# Patient Record
Sex: Female | Born: 1965 | Race: White | Hispanic: No | Marital: Married | Smoking: Never smoker
Health system: Southern US, Community
[De-identification: ages and names within clinical notes are randomized; demographics above are authoritative.]

## PROBLEM LIST (undated history)

## (undated) DIAGNOSIS — I10 Essential (primary) hypertension: Secondary | ICD-10-CM

## (undated) HISTORY — DX: Essential (primary) hypertension: I10

## (undated) HISTORY — PX: NO PAST SURGERIES: SHX2092

---

## 2005-01-24 ENCOUNTER — Ambulatory Visit: Payer: Self-pay | Admitting: Unknown Physician Specialty

## 2006-02-12 ENCOUNTER — Ambulatory Visit: Payer: Self-pay | Admitting: Unknown Physician Specialty

## 2007-04-01 ENCOUNTER — Ambulatory Visit: Payer: Self-pay | Admitting: Unknown Physician Specialty

## 2008-04-26 ENCOUNTER — Ambulatory Visit: Payer: Self-pay | Admitting: Unknown Physician Specialty

## 2011-08-11 LAB — HM PAP SMEAR: HM Pap smear: NEGATIVE

## 2017-09-22 ENCOUNTER — Encounter: Payer: Self-pay | Admitting: Physician Assistant

## 2017-09-22 ENCOUNTER — Ambulatory Visit: Payer: 59 | Admitting: Physician Assistant

## 2017-09-22 VITALS — BP 134/86 | HR 68 | Temp 98.5°F | Resp 16 | Ht 68.0 in | Wt 238.0 lb

## 2017-09-22 DIAGNOSIS — Z131 Encounter for screening for diabetes mellitus: Secondary | ICD-10-CM

## 2017-09-22 DIAGNOSIS — Z13220 Encounter for screening for lipoid disorders: Secondary | ICD-10-CM

## 2017-09-22 DIAGNOSIS — Z1329 Encounter for screening for other suspected endocrine disorder: Secondary | ICD-10-CM | POA: Diagnosis not present

## 2017-09-22 DIAGNOSIS — E669 Obesity, unspecified: Secondary | ICD-10-CM

## 2017-09-22 DIAGNOSIS — Z114 Encounter for screening for human immunodeficiency virus [HIV]: Secondary | ICD-10-CM | POA: Diagnosis not present

## 2017-09-22 DIAGNOSIS — Z6835 Body mass index (BMI) 35.0-35.9, adult: Secondary | ICD-10-CM

## 2017-09-22 DIAGNOSIS — Z130 Encounter for screening for diseases of the blood and blood-forming organs and certain disorders involving the immune mechanism: Secondary | ICD-10-CM | POA: Diagnosis not present

## 2017-09-22 DIAGNOSIS — Z1231 Encounter for screening mammogram for malignant neoplasm of breast: Secondary | ICD-10-CM

## 2017-09-22 DIAGNOSIS — Z0000 Encounter for general adult medical examination without abnormal findings: Secondary | ICD-10-CM | POA: Diagnosis not present

## 2017-09-22 DIAGNOSIS — Z1159 Encounter for screening for other viral diseases: Secondary | ICD-10-CM | POA: Diagnosis not present

## 2017-09-22 DIAGNOSIS — Z1211 Encounter for screening for malignant neoplasm of colon: Secondary | ICD-10-CM | POA: Diagnosis not present

## 2017-09-22 DIAGNOSIS — Z1239 Encounter for other screening for malignant neoplasm of breast: Secondary | ICD-10-CM

## 2017-09-22 NOTE — Patient Instructions (Signed)
Serving Sizes  A serving size is a measured amount of food or drink, such as one slice of bread, that has an associated nutrient content. Knowing the serving size of a food or drink can help you determine how much of that food you should consume.  What is the size of one serving?  The size of one healthy serving depends on the food or drink. To determine a serving size, read the food label. If the food or drink does not have a food label, try to find serving size information online. Or, use the following to estimate the size of one adult serving:  Grain  1 slice bread.  bagel.  cup pasta.  Vegetable   cup cooked or canned vegetables. 1 cup raw, leafy greens.  Fruit   cup canned fruit. 1 medium fruit.  cup dried fruit.  Meat and Other Protein Sources  1 oz meat, poultry, or fish.  cup cooked beans. 1 egg.  cup nuts or seeds. 1 Tbsp nut butter.  cup tofu or tempeh. 2 Tbsp hummus.  Dairy  An individual container of yogurt (6-8 oz). 1 piece of cheese the size of your thumb (1 oz). 1 cup (8 oz) milk or milk alternative.  Fat  A piece the size of one dice. 1 tsp soft margarine. 1 Tbsp mayonnaise. 1 tsp vegetable oil. 1 Tbsp regular salad dressing. 2 Tbsp low-fat salad dressing.  How many servings should I eat from each food group each day?  The following are the suggested number of servings to try and have every day from each food group. You can also look at your eating throughout the week and aim for meeting these requirements on most days for overall healthy eating.  Grain  6-8 servings. Try to have half of your grains from whole grains, such as whole wheat bread, corn tortillas, oatmeal, brown rice, whole wheat pasta, and bulgur.  Vegetable  At least 2-3 servings.  Fruit  2 servings.  Meat and Other Protein Foods  5-6 servings. Aim to have lean proteins, such as chicken, turkey, fish, beans, or tofu.  Dairy  3 servings. Choose low-fat or nonfat if you are trying to control your weight.  Fat  2-3  servings.  Is a serving the same thing as a portion?  No. A portion is the actual amount you eat, which may be more than one serving. Knowing the specific serving size of a food and the nutritional information that goes with it can help you make a healthy decision on what size portion to eat.  What are some tips to help me learn healthy serving sizes?  Check food labels for serving sizes. Many foods that come as a single portion actually contain multiple servings.  Determine the serving size of foods you commonly eat and figure out how large a portion you usually eat.  Measure the number of servings that can be held by the bowls, glasses, cups, and plates you typically use. For example, pour your breakfast cereal into your regular bowl and then pour it into a measuring cup.  For 1-2 days, measure the serving sizes of all the foods you eat.  Practice estimating serving sizes and determining how big your portions should be.  This information is not intended to replace advice given to you by your health care provider. Make sure you discuss any questions you have with your health care provider.  Document Released: 12/28/2002 Document Revised: 11/24/2015 Document Reviewed: 06/28/2013  Elsevier Interactive Patient   Education  2018 Elsevier Inc.

## 2017-09-22 NOTE — Progress Notes (Signed)
Patient: Madeline Bryant Female    DOB: Feb 29, 1966   52 y.o.   MRN: [id number] Visit Date: 09/23/2017  Today's Provider: Trey SailorsAdriana M Pollak, PA-C   Chief Complaint  Patient presents with  . Establish Care   Subjective:    HPI  Madeline Bryant is a 52 y/o woman presenting today to establish care. 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 Her husband is a patient here as well. [facility]. Due for mammogram. Has had borderline high BP in the past. Has lost 9 lbs with weight watchers.    No Known Allergies   Current Outpatient Medications:  Marland Kitchen.  Multiple Vitamin (MULTIVITAMIN) tablet, Take 1 tablet by mouth daily., Disp: , Rfl:   Review of Systems  Constitutional: Negative.   HENT: Negative.   Eyes: Negative.   Respiratory: Negative.   Cardiovascular: Negative.   Gastrointestinal: Negative.   Endocrine: Negative.   Genitourinary: Negative.   Musculoskeletal: Negative.   Skin: Negative.   Allergic/Immunologic: Negative.   Neurological: Negative.   Hematological: Negative.   Psychiatric/Behavioral: Negative.     Social History   Tobacco Use  . Smoking status: Never Smoker  . Smokeless tobacco: Never Used  Substance Use Topics  . Alcohol use: Never    Frequency: Never   Objective:   BP 134/86 (BP Location: Right Arm, Patient Position: Sitting, Cuff Size: Large)   Pulse 68   Temp 98.5 F (36.9 C) (Oral)   Resp 16   Ht 5\' 8"  (1.727 m)   Wt 238 lb (108 kg)   BMI 36.19 kg/m  Vitals:   09/22/17 1415  BP: 134/86  Pulse: 68  Resp: 16  Temp: 98.5 F (36.9 C)  TempSrc: Oral  Weight: 238 lb (108 kg)  Height: 5\' 8"  (1.727 m)     Physical Exam  Constitutional: She is oriented to person, place, and time. She appears well-developed and well-nourished.  Cardiovascular: Normal rate and regular rhythm.  Pulmonary/Chest: Effort normal and breath sounds normal.  Abdominal: Soft. Bowel sounds are normal.    Neurological: She is alert and oriented to person, place, and time.  Skin: Skin is warm and dry.  Psychiatric: She has a normal mood and affect. Her behavior is normal.        Assessment & Plan:     1. Class 2 obesity without serious comorbidity with body mass index (BMI) of 35.0 to 35.9 in adult, unspecified obesity type  Get labs and request records, update PAP at physical exam.  2. Diabetes mellitus screening  - Comprehensive Metabolic Panel (CMET)  3. Thyroid disorder screening  - TSH  4. Lipid screening  - Lipid Profile  5. Screening for deficiency anemia  - CBC with Differential  6. Encounter for screening for HIV  - HIV antibody (with reflex)  7. Need for hepatitis C screening test  - Hepatitis c antibody (reflex)  8. Breast cancer screening  - MM 3D SCREEN BREAST BILATERAL; Future  9. Colon cancer screening  - Cologuard  Return in about 1 month (around 10/22/2017) for CPE.  The entirety of the information documented in the History of Present Illness, Review of Systems and Physical Exam were personally obtained by me. Portions of this information were initially documented by Kavin LeechLaura Walsh, CMA and reviewed by me for thoroughness and accuracy.        Trey SailorsAdriana M Pollak, PA-C  [facility]

## 2017-09-24 ENCOUNTER — Ambulatory Visit
Admission: RE | Admit: 2017-09-24 | Discharge: 2017-09-24 | Disposition: A | Discharge status: Home / Self Care | Payer: 59 | Source: Ambulatory Visit | Attending: Physician Assistant | Admitting: Physician Assistant

## 2017-09-24 DIAGNOSIS — Z1231 Encounter for screening mammogram for malignant neoplasm of breast: Secondary | ICD-10-CM | POA: Insufficient documentation

## 2017-09-24 DIAGNOSIS — Z1239 Encounter for other screening for malignant neoplasm of breast: Secondary | ICD-10-CM

## 2017-09-25 ENCOUNTER — Telehealth: Payer: Self-pay

## 2017-09-25 LAB — COMPREHENSIVE METABOLIC PANEL WITH GFR
ALT: 36 IU/L — ABNORMAL HIGH (ref 0–32)
AST: 31 IU/L (ref 0–40)
Albumin/Globulin Ratio: 1.6 (ref 1.2–2.2)
Albumin: 4.3 g/dL (ref 3.5–5.5)
Alkaline Phosphatase: 94 IU/L (ref 39–117)
BUN/Creatinine Ratio: 15 (ref 9–23)
BUN: 11 mg/dL (ref 6–24)
Bilirubin Total: 0.4 mg/dL (ref 0.0–1.2)
CO2: 25 mmol/L (ref 20–29)
Calcium: 8.9 mg/dL (ref 8.7–10.2)
Chloride: 105 mmol/L (ref 96–106)
Creatinine, Ser: 0.74 mg/dL (ref 0.57–1.00)
GFR calc Af Amer: 107 mL/min/1.73
GFR calc non Af Amer: 93 mL/min/1.73
Globulin, Total: 2.7 g/dL (ref 1.5–4.5)
Glucose: 87 mg/dL (ref 65–99)
Potassium: 4.1 mmol/L (ref 3.5–5.2)
Sodium: 145 mmol/L — ABNORMAL HIGH (ref 134–144)
Total Protein: 7 g/dL (ref 6.0–8.5)

## 2017-09-25 LAB — CBC WITH DIFFERENTIAL/PLATELET
Basophils Absolute: 0.1 10*3/uL (ref 0.0–0.2)
Basos: 1 %
EOS (ABSOLUTE): 0.1 10*3/uL (ref 0.0–0.4)
Eos: 2 %
Hematocrit: 44 % (ref 34.0–46.6)
Hemoglobin: 15 g/dL (ref 11.1–15.9)
Immature Grans (Abs): 0 10*3/uL (ref 0.0–0.1)
Immature Granulocytes: 0 %
Lymphocytes Absolute: 2.2 10*3/uL (ref 0.7–3.1)
Lymphs: 41 %
MCH: 29.2 pg (ref 26.6–33.0)
MCHC: 34.1 g/dL (ref 31.5–35.7)
MCV: 86 fL (ref 79–97)
Monocytes Absolute: 0.3 10*3/uL (ref 0.1–0.9)
Monocytes: 5 %
Neutrophils Absolute: 2.9 10*3/uL (ref 1.4–7.0)
Neutrophils: 51 %
Platelets: 223 10*3/uL (ref 150–450)
RBC: 5.13 x10E6/uL (ref 3.77–5.28)
RDW: 14.7 % (ref 12.3–15.4)
WBC: 5.5 10*3/uL (ref 3.4–10.8)

## 2017-09-25 LAB — LIPID PANEL
Chol/HDL Ratio: 3.3 ratio (ref 0.0–4.4)
Cholesterol, Total: 173 mg/dL (ref 100–199)
HDL: 52 mg/dL (ref >39)
LDL Calculated: 105 mg/dL — ABNORMAL HIGH (ref 0–99)
Triglycerides: 80 mg/dL (ref 0–149)
VLDL Cholesterol Cal: 16 mg/dL (ref 5–40)

## 2017-09-25 LAB — TSH: TSH: 2.45 u[IU]/mL (ref 0.450–4.500)

## 2017-09-25 LAB — HIV ANTIBODY (ROUTINE TESTING W REFLEX): HIV Screen 4th Generation wRfx: NONREACTIVE

## 2017-09-25 LAB — HCV COMMENT:

## 2017-09-25 LAB — HEPATITIS C ANTIBODY (REFLEX): HCV Ab: <0.1 {s_co_ratio} (ref 0.0–0.9)

## 2017-09-25 NOTE — Telephone Encounter (Signed)
-----   Message from Trey SailorsAdriana M Pollak, [place]PA-C sent at 09/25/2017  1:43 PM EDT ----- Labs all normal.

## 2017-09-25 NOTE — Telephone Encounter (Signed)
Pt advised.   Thanks,   -Laura  

## 2017-09-30 ENCOUNTER — Telehealth: Payer: Self-pay | Admitting: Physician Assistant

## 2017-09-30 NOTE — Telephone Encounter (Signed)
Order for cologuard faxed to [facility] °

## 2017-10-08 ENCOUNTER — Encounter: Payer: Self-pay | Admitting: Physician Assistant

## 2017-10-16 ENCOUNTER — Telehealth: Payer: Self-pay | Admitting: Physician Assistant

## 2017-10-16 NOTE — Telephone Encounter (Signed)
Patient is calling wanting to know why her office visit on 09/25/2017 was not coded as a wellness exam or physical.  She states that her lab work was not coded as wellness so her insurance is not paying for it.  They are applying it to her deductable.

## 2017-10-18 LAB — COLOGUARD: Cologuard: NEGATIVE

## 2017-10-21 NOTE — Telephone Encounter (Signed)
Patient called office again to get clarity on examination, patient states that she needs code from office visit to be for preventative reasons and not medical. Patient request call back. KW

## 2017-10-22 NOTE — Telephone Encounter (Signed)
Recoded and resubmitted.

## 2017-10-22 NOTE — Addendum Note (Signed)
Addended by: Trey SailorsPOLLAK, ADRIANA M on: 10/22/2017 08:54 AM   Modules accepted: Level of Service

## 2017-10-30 ENCOUNTER — Encounter: Payer: Self-pay | Admitting: Physician Assistant

## 2017-12-03 ENCOUNTER — Encounter: Payer: Self-pay | Admitting: Physician Assistant

## 2017-12-18 ENCOUNTER — Ambulatory Visit (INDEPENDENT_AMBULATORY_CARE_PROVIDER_SITE_OTHER): Payer: 59 | Admitting: Physician Assistant

## 2017-12-18 ENCOUNTER — Encounter: Payer: Self-pay | Admitting: Physician Assistant

## 2017-12-18 ENCOUNTER — Other Ambulatory Visit (HOSPITAL_COMMUNITY)
Admission: RE | Admit: 2017-12-18 | Discharge: 2017-12-18 | Disposition: A | Discharge status: Home / Self Care | Payer: 59 | Source: Ambulatory Visit | Attending: Physician Assistant | Admitting: Physician Assistant

## 2017-12-18 VITALS — BP 128/86 | HR 84 | Temp 98.4°F | Resp 16 | Ht 68.0 in | Wt 228.0 lb

## 2017-12-18 DIAGNOSIS — Z1151 Encounter for screening for human papillomavirus (HPV): Secondary | ICD-10-CM | POA: Diagnosis not present

## 2017-12-18 DIAGNOSIS — Z124 Encounter for screening for malignant neoplasm of cervix: Secondary | ICD-10-CM

## 2017-12-18 NOTE — Progress Notes (Signed)
cyt      Patient: Madeline Bryant Female    DOB: November 06, 1965   52 y.o.   MRN: [id number] Visit Date: 12/18/2017  Today's Provider: Trey Sailors, PA-C   Chief Complaint  Patient presents with  . Gynecologic Exam   Subjective:    HPI  Presents today for PAP smear. Does not have history of abnormal pap smears, no removal of cells from cervix. Denies vaginal bleeding.   BP Readings from Last 3 Encounters:  12/18/17 128/86  09/22/17 134/86       No Known Allergies   Current Outpatient Medications:  Marland Kitchen  Multiple Vitamin (MULTIVITAMIN) tablet, Take 1 tablet by mouth daily., Disp: , Rfl:   Review of Systems  Constitutional: Negative.   Respiratory: Negative.   Cardiovascular: Negative.   Genitourinary: Negative.     Social History   Tobacco Use  . Smoking status: Never Smoker  . Smokeless tobacco: Never Used  Substance Use Topics  . Alcohol use: Never    Frequency: Never   Objective:   BP 128/86 (BP Location: Left Arm, Patient Position: Sitting, Cuff Size: Large)   Pulse 84   Temp 98.4 F (36.9 C) (Oral)   Resp 16   Ht 5\' 8"  (1.727 m)   Wt 228 lb (103.4 kg)   SpO2 98%   BMI 34.67 kg/m  Vitals:   12/18/17 0901  BP: 128/86  Pulse: 84  Resp: 16  Temp: 98.4 F (36.9 C)  TempSrc: Oral  SpO2: 98%  Weight: 228 lb (103.4 kg)  Height: 5\' 8"  (1.727 m)     Physical Exam  Constitutional: She is oriented to person, place, and time. She appears well-developed and well-nourished.  Genitourinary: Uterus normal. Uterus is not deviated, not enlarged, not fixed and not tender. Cervix exhibits no motion tenderness, no discharge and no friability. Right adnexum displays no mass, no tenderness and no fullness. Left adnexum displays no mass, no tenderness and no fullness. No erythema, tenderness or bleeding in the vagina. No foreign body in the vagina. No signs of injury around the vagina. No vaginal discharge found.  Neurological: She is alert and oriented to person,  place, and time.  Skin: Skin is warm and dry.  Psychiatric: She has a normal mood and affect. Her behavior is normal.        Assessment & Plan:     1. Cervical cancer screening  PAP with HPV today.   - Cytology - PAP  Return in about 1 year (around 12/19/2018) for CPE.  The entirety of the information documented in the History of Present Illness, Review of Systems and Physical Exam were personally obtained by me. Portions of this information were initially documented by Rondel Baton, CMA and reviewed by me for thoroughness and accuracy.         Trey Sailors, PA-C  [facility]

## 2017-12-22 LAB — CYTOLOGY - PAP
Diagnosis: NEGATIVE
HPV: NOT DETECTED

## 2018-07-12 ENCOUNTER — Encounter: Payer: Self-pay | Admitting: Physician Assistant

## 2019-02-02 ENCOUNTER — Ambulatory Visit (INDEPENDENT_AMBULATORY_CARE_PROVIDER_SITE_OTHER): Payer: 59 | Admitting: Physician Assistant

## 2019-02-02 ENCOUNTER — Encounter: Payer: Self-pay | Admitting: Physician Assistant

## 2019-02-02 ENCOUNTER — Other Ambulatory Visit: Payer: Self-pay

## 2019-02-02 VITALS — BP 138/84 | HR 98 | Temp 96.8°F | Resp 16 | Ht 68.0 in | Wt 252.0 lb

## 2019-02-02 DIAGNOSIS — Z1231 Encounter for screening mammogram for malignant neoplasm of breast: Secondary | ICD-10-CM | POA: Diagnosis not present

## 2019-02-02 DIAGNOSIS — G2581 Restless legs syndrome: Secondary | ICD-10-CM

## 2019-02-02 DIAGNOSIS — Z0000 Encounter for general adult medical examination without abnormal findings: Secondary | ICD-10-CM

## 2019-02-02 MED ORDER — ROPINIROLE HCL 1 MG PO TABS: 1.0000 mg | ORAL_TABLET | Freq: Every day | ORAL | 0 refills | Status: DC

## 2019-02-02 NOTE — Progress Notes (Signed)
Patient: Madeline Bryant, Female    DOB: 1965-12-07, 53 y.o.   MRN: [id number] Visit Date: 02/02/2019  Today's Provider: Trey Sailors, PA-C   Chief Complaint  Patient presents with  . Annual Exam   Subjective:     Annual physical exam Madeline Bryant is a 53 y.o. female who presents today for health maintenance and complete physical. She feels fairly well. She reports exercising 3 times weekly. She reports she is sleeping fairly well. 09/22/2017 CPE 12/18/2017 Pap-negative 10/18/2017 Cologuard-negative 09/24/2017 Mammogram-BI-RADS 1  Patient reports an urge to move her legs at night that causes discomfort. She has had this for some time but it has been worsening.  -----------------------------------------------------------------   Review of Systems  Constitutional: Negative.   HENT: Negative.   Eyes: Negative.   Respiratory: Negative.   Cardiovascular: Negative.   Gastrointestinal: Negative.   Endocrine: Negative.   Genitourinary: Negative.   Musculoskeletal: Negative.   Skin: Negative.   Allergic/Immunologic: Negative.   Neurological: Negative.   Hematological: Negative.   Psychiatric/Behavioral: Negative.     Social History      She  reports that she has never smoked. She has never used smokeless tobacco. She reports that she does not drink alcohol or use drugs.       Social History   Socioeconomic History  . Marital status: Married    Spouse name: Not on file  . Number of children: Not on file  . Years of education: Not on file  . Highest education level: Not on file  Occupational History  . Not on file  Social Needs  . Financial resource strain: Not on file  . Food insecurity    Worry: Not on file    Inability: Not on file  . Transportation needs    Medical: Not on file    Non-medical: Not on file  Tobacco Use  . Smoking status: Never Smoker  . Smokeless tobacco: Never Used  Substance and Sexual Activity  . Alcohol use: Never   Frequency: Never  . Drug use: Never  . Sexual activity: Not on file  Lifestyle  . Physical activity    Days per week: Not on file    Minutes per session: Not on file  . Stress: Not on file  Relationships  . Social Musician on phone: Not on file    Gets together: Not on file    Attends religious service: Not on file    Active member of club or organization: Not on file    Attends meetings of clubs or organizations: Not on file    Relationship status: Not on file  Other Topics Concern  . Not on file  Social History Narrative  . Not on file    No past medical history on file.   There are no active problems to display for this patient.   Past Surgical History:  Procedure Laterality Date  . NO PAST SURGERIES      Family History        Family Status  Relation Name Status  . Mother  Alive  . Father  Deceased at age 10       causes of Death: CHF  . Brother  Deceased  . Neg Hx  (Not Specified)        Her family history includes COPD in her father; Congestive Heart Failure in her father; Esophageal cancer in her brother; Hypertension in her father and mother; Kidney  failure in her father; Prostate cancer in her father. There is no history of Breast cancer.      No Known Allergies   Current Outpatient Medications:  Marland Kitchen.  Multiple Vitamin (MULTIVITAMIN) tablet, Take 1 tablet by mouth daily., Disp: , Rfl:  .  rOPINIRole (REQUIP) 1 MG tablet, Take 1 tablet (1 mg total) by mouth at bedtime., Disp: 90 tablet, Rfl: 0   Patient Care Team: Maryella ShiversPollak, Adriana M, PA-C as PCP - General (Physician Assistant)    Objective:    Vitals: BP 138/84 (BP Location: Left Arm, Patient Position: Sitting, Cuff Size: Large)   Pulse 98   Temp (!) 96.8 F (36 C) (Temporal)   Resp 16   Ht 5\' 8"  (1.727 m)   Wt 252 lb (114.3 kg)   SpO2 98% Comment: room air  BMI 38.32 kg/m    Vitals:   02/02/19 1510  BP: 138/84  Pulse: 98  Resp: 16  Temp: (!) 96.8 F (36 C)  TempSrc: Temporal   SpO2: 98%  Weight: 252 lb (114.3 kg)  Height: 5\' 8"  (1.727 m)     Physical Exam Constitutional:      Appearance: Normal appearance.  Cardiovascular:     Rate and Rhythm: Normal rate and regular rhythm.     Heart sounds: Normal heart sounds.  Pulmonary:     Effort: Pulmonary effort is normal.     Breath sounds: Normal breath sounds.  Chest:     Breasts:        Right: Normal.        Left: Normal.  Abdominal:     General: Bowel sounds are normal.     Palpations: Abdomen is soft.  Skin:    General: Skin is warm and dry.  Neurological:     Mental Status: She is alert and oriented to person, place, and time. Mental status is at baseline.  Psychiatric:        Mood and Affect: Mood normal.        Behavior: Behavior normal.      Depression Screen PHQ 2/9 Scores 02/02/2019 09/22/2017  PHQ - 2 Score 0 0  PHQ- 9 Score 0 0       Assessment & Plan:     Routine Health Maintenance and Physical Exam  Exercise Activities and Dietary recommendations Goals   None     Immunization History  Administered Date(s) Administered  . Influenza,inj,Quad PF,6+ Mos 01/12/2019    Health Maintenance  Topic Date Due  . TETANUS/TDAP  01/24/1983  . MAMMOGRAM  09/25/2019  . Fecal DNA (Cologuard)  10/18/2020  . PAP SMEAR-Modifier  12/18/2020  . INFLUENZA VACCINE  Completed  . Hepatitis C Screening  Completed  . HIV Screening  Completed     Discussed health benefits of physical activity, and encouraged her to engage in regular exercise appropriate for her age and condition.    1. Annual physical exam  - CBC with Differential/Platelet - Comprehensive metabolic panel - Lipid Panel With LDL/HDL Ratio - TSH  2. Encounter for screening mammogram for malignant neoplasm of breast  - MM 3D SCREEN BREAST BILATERAL; Future  3. Restless leg syndrome  - rOPINIRole (REQUIP) 1 MG tablet; Take 1 tablet (1 mg total) by mouth at bedtime.  Dispense: 90 tablet; Refill: 0  The entirety of  the information documented in the History of Present Illness, Review of Systems and Physical Exam were personally obtained by me. Portions of this information were initially documented by Rondel BatonSulibeya Dimas, CMA  and reviewed by me for thoroughness and accuracy.   --------------------------------------------------------------------    Trinna Post, PA-C  [facility]

## 2019-02-03 ENCOUNTER — Telehealth: Payer: Self-pay

## 2019-02-03 LAB — COMPREHENSIVE METABOLIC PANEL
ALT: 37 IU/L — ABNORMAL HIGH (ref 0–32)
AST: 31 IU/L (ref 0–40)
Albumin/Globulin Ratio: 1.6 (ref 1.2–2.2)
Albumin: 4.4 g/dL (ref 3.8–4.9)
Alkaline Phosphatase: 98 IU/L (ref 39–117)
BUN/Creatinine Ratio: 19 (ref 9–23)
BUN: 13 mg/dL (ref 6–24)
Bilirubin Total: 0.3 mg/dL (ref 0.0–1.2)
CO2: 24 mmol/L (ref 20–29)
Calcium: 9.6 mg/dL (ref 8.7–10.2)
Chloride: 102 mmol/L (ref 96–106)
Creatinine, Ser: 0.68 mg/dL (ref 0.57–1.00)
GFR calc Af Amer: 114 mL/min/{1.73_m2} (ref >59)
GFR calc non Af Amer: 99 mL/min/{1.73_m2} (ref >59)
Globulin, Total: 2.8 g/dL (ref 1.5–4.5)
Glucose: 102 mg/dL — ABNORMAL HIGH (ref 65–99)
Potassium: 4.2 mmol/L (ref 3.5–5.2)
Sodium: 140 mmol/L (ref 134–144)
Total Protein: 7.2 g/dL (ref 6.0–8.5)

## 2019-02-03 LAB — CBC WITH DIFFERENTIAL/PLATELET
Basophils Absolute: 0.1 10*3/uL (ref 0.0–0.2)
Basos: 1 %
EOS (ABSOLUTE): 0.1 10*3/uL (ref 0.0–0.4)
Eos: 2 %
Hematocrit: 45.7 % (ref 34.0–46.6)
Hemoglobin: 15.2 g/dL (ref 11.1–15.9)
Immature Grans (Abs): 0 10*3/uL (ref 0.0–0.1)
Immature Granulocytes: 0 %
Lymphocytes Absolute: 2.2 10*3/uL (ref 0.7–3.1)
Lymphs: 29 %
MCH: 28.5 pg (ref 26.6–33.0)
MCHC: 33.3 g/dL (ref 31.5–35.7)
MCV: 86 fL (ref 79–97)
Monocytes Absolute: 0.5 10*3/uL (ref 0.1–0.9)
Monocytes: 7 %
Neutrophils Absolute: 4.5 10*3/uL (ref 1.4–7.0)
Neutrophils: 61 %
Platelets: 241 10*3/uL (ref 150–450)
RBC: 5.34 x10E6/uL — ABNORMAL HIGH (ref 3.77–5.28)
RDW: 13.5 % (ref 11.7–15.4)
WBC: 7.4 10*3/uL (ref 3.4–10.8)

## 2019-02-03 LAB — LIPID PANEL WITH LDL/HDL RATIO
Cholesterol, Total: 197 mg/dL (ref 100–199)
HDL: 59 mg/dL (ref >39)
LDL Chol Calc (NIH): 112 mg/dL — ABNORMAL HIGH (ref 0–99)
LDL/HDL Ratio: 1.9 ratio (ref 0.0–3.2)
Triglycerides: 149 mg/dL (ref 0–149)
VLDL Cholesterol Cal: 26 mg/dL (ref 5–40)

## 2019-02-03 LAB — TSH: TSH: 2.3 u[IU]/mL (ref 0.450–4.500)

## 2019-02-03 NOTE — Telephone Encounter (Signed)
Called [facility] and verbally added test. Awaiting confirmation.

## 2019-02-03 NOTE — Patient Instructions (Signed)
Health Maintenance, Female Adopting a healthy lifestyle and getting preventive care are important in promoting health and wellness. Ask your health care provider about:  The right schedule for you to have regular tests and exams.  Things you can do on your own to prevent diseases and keep yourself healthy. What should I know about diet, weight, and exercise? Eat a healthy diet   Eat a diet that includes plenty of vegetables, fruits, low-fat dairy products, and lean protein.  Do not eat a lot of foods that are high in solid fats, added sugars, or sodium. Maintain a healthy weight Body mass index (BMI) is used to identify weight problems. It estimates body fat based on height and weight. Your health care provider can help determine your BMI and help you achieve or maintain a healthy weight. Get regular exercise Get regular exercise. This is one of the most important things you can do for your health. Most adults should:  Exercise for at least 150 minutes each week. The exercise should increase your heart rate and make you sweat (moderate-intensity exercise).  Do strengthening exercises at least twice a week. This is in addition to the moderate-intensity exercise.  Spend less time sitting. Even light physical activity can be beneficial. Watch cholesterol and blood lipids Have your blood tested for lipids and cholesterol at 55 years of age, then have this test every 5 years. Have your cholesterol levels checked more often if:  Your lipid or cholesterol levels are high.  You are older than 55 years of age.  You are at high risk for heart disease. What should I know about cancer screening? Depending on your health history and family history, you may need to have cancer screening at various ages. This may include screening for:  Breast cancer.  Cervical cancer.  Colorectal cancer.  Skin cancer.  Lung cancer. What should I know about heart disease, diabetes, and high blood  pressure? Blood pressure and heart disease  High blood pressure causes heart disease and increases the risk of stroke. This is more likely to develop in people who have high blood pressure readings, are of African descent, or are overweight.  Have your blood pressure checked: ? Every 3-5 years if you are 18-39 years of age. ? Every year if you are 40 years old or older. Diabetes Have regular diabetes screenings. This checks your fasting blood sugar level. Have the screening done:  Once every three years after age 40 if you are at a normal weight and have a low risk for diabetes.  More often and at a younger age if you are overweight or have a high risk for diabetes. What should I know about preventing infection? Hepatitis B If you have a higher risk for hepatitis B, you should be screened for this virus. Talk with your health care provider to find out if you are at risk for hepatitis B infection. Hepatitis C Testing is recommended for:  Everyone born from 1945 through 1965.  Anyone with known risk factors for hepatitis C. Sexually transmitted infections (STIs)  Get screened for STIs, including gonorrhea and chlamydia, if: ? You are sexually active and are younger than 55 years of age. ? You are older than 55 years of age and your health care provider tells you that you are at risk for this type of infection. ? Your sexual activity has changed since you were last screened, and you are at increased risk for chlamydia or gonorrhea. Ask your health care provider if   you are at risk.  Ask your health care provider about whether you are at high risk for HIV. Your health care provider may recommend a prescription medicine to help prevent HIV infection. If you choose to take medicine to prevent HIV, you should first get tested for HIV. You should then be tested every 3 months for as long as you are taking the medicine. Pregnancy  If you are about to stop having your period (premenopausal) and  you may become pregnant, seek counseling before you get pregnant.  Take 400 to 800 micrograms (mcg) of folic acid every day if you become pregnant.  Ask for birth control (contraception) if you want to prevent pregnancy. Osteoporosis and menopause Osteoporosis is a disease in which the bones lose minerals and strength with aging. This can result in bone fractures. If you are 65 years old or older, or if you are at risk for osteoporosis and fractures, ask your health care provider if you should:  Be screened for bone loss.  Take a calcium or vitamin D supplement to lower your risk of fractures.  Be given hormone replacement therapy (HRT) to treat symptoms of menopause. Follow these instructions at home: Lifestyle  Do not use any products that contain nicotine or tobacco, such as cigarettes, e-cigarettes, and chewing tobacco. If you need help quitting, ask your health care provider.  Do not use street drugs.  Do not share needles.  Ask your health care provider for help if you need support or information about quitting drugs. Alcohol use  Do not drink alcohol if: ? Your health care provider tells you not to drink. ? You are pregnant, may be pregnant, or are planning to become pregnant.  If you drink alcohol: ? Limit how much you use to 0-1 drink a day. ? Limit intake if you are breastfeeding.  Be aware of how much alcohol is in your drink. In the [place] one drink equals one 12 oz bottle of beer (355 mL), one 5 oz glass of wine (148 mL), or one 1 oz glass of hard liquor (44 mL). General instructions  Schedule regular health, dental, and eye exams.  Stay current with your vaccines.  Tell your health care provider if: ? You often feel depressed. ? You have ever been abused or do not feel safe at home. Summary  Adopting a healthy lifestyle and getting preventive care are important in promoting health and wellness.  Follow your health care provider's instructions about healthy  diet, exercising, and getting tested or screened for diseases.  Follow your health care provider's instructions on monitoring your cholesterol and blood pressure. This information is not intended to replace advice given to you by your health care provider. Make sure you discuss any questions you have with your health care provider. Document Released: 10/14/2010 Document Revised: 03/24/2018 Document Reviewed: 03/24/2018 Elsevier Patient Education  2020 Elsevier Inc.  

## 2019-02-03 NOTE — Telephone Encounter (Signed)
-----   Message from Trinna Post, [place] sent at 02/03/2019  9:06 AM EDT ----- Can we please add a1c under dx hyperglycemia? Thank you.    Richel,   Your labwork appears normal except for one slightly elevated liver enzyme which is stable from last year. Additionally, your sugar is slightly high as well and I am adding an A1c test which will look further for any borderline diabetes or diabetes.   Best, Fabio Bering

## 2019-02-06 LAB — SPECIMEN STATUS REPORT

## 2019-02-06 LAB — HGB A1C W/O EAG: Hgb A1c MFr Bld: 5.8 % — ABNORMAL HIGH (ref 4.8–5.6)

## 2019-04-26 ENCOUNTER — Other Ambulatory Visit: Payer: Self-pay | Admitting: Physician Assistant

## 2019-04-26 ENCOUNTER — Ambulatory Visit
Admission: RE | Admit: 2019-04-26 | Discharge: 2019-04-26 | Disposition: A | Discharge status: Home / Self Care | Payer: 59 | Source: Ambulatory Visit | Attending: Physician Assistant | Admitting: Physician Assistant

## 2019-04-26 DIAGNOSIS — Z1231 Encounter for screening mammogram for malignant neoplasm of breast: Secondary | ICD-10-CM | POA: Diagnosis not present

## 2019-04-26 DIAGNOSIS — G2581 Restless legs syndrome: Secondary | ICD-10-CM

## 2019-04-26 NOTE — Telephone Encounter (Signed)
Requested medication (s) are due for refill today: yes  Requested medication (s) are on the active medication list: yes  Last refill:  02/02/2019  Future visit scheduled: no  Notes to clinic:  no valid encounter in last 12 months    Requested Prescriptions  Pending Prescriptions Disp Refills   rOPINIRole (REQUIP) 1 MG tablet [Pharmacy Med Name: ROPINIROLE HCL 1 MG TABLET] 90 tablet 0    Sig: TAKE 1 TABLET BY MOUTH AT BEDTIME      Neurology:  Parkinsonian Agents Failed - 04/26/2019 12:41 AM      Failed - Valid encounter within last 12 months    Recent Outpatient Visits           2 months ago Annual physical exam   [facility] Trey Sailors, [place]   1 year ago Cervical cancer screening   [facility] Trey Sailors, [place]   1 year ago Annual physical exam   [facility] Osvaldo Angst M, [place]              Passed - Last BP in normal range    BP Readings from Last 1 Encounters:  02/02/19 138/84

## 2019-05-02 NOTE — Telephone Encounter (Signed)
L.O.V. was on 02/02/2019 and no upcoming appointment.Please advise.

## 2019-05-20 IMAGING — MG MM DIGITAL SCREENING BILAT W/ TOMO W/ CAD
8 series · 8 of 24 positions shown · non-contrast
Comparison: Previous exam(s).

CLINICAL DATA: Screening.

EXAM:
DIGITAL SCREENING BILATERAL MAMMOGRAM WITH TOMO AND CAD

[L CC synth-2D]
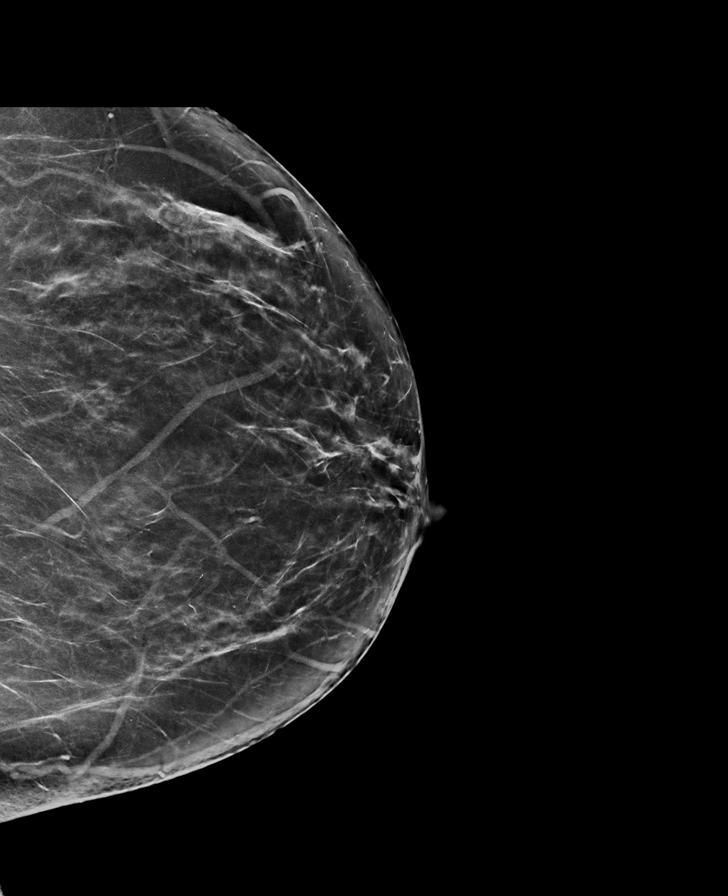

[L MLO synth-2D]
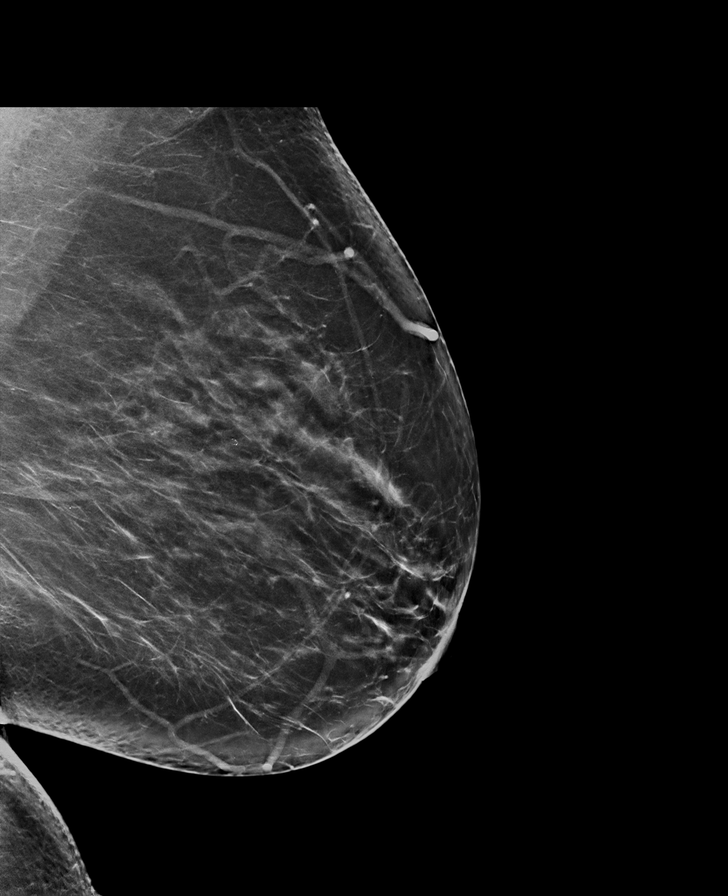

[R MLO synth-2D]
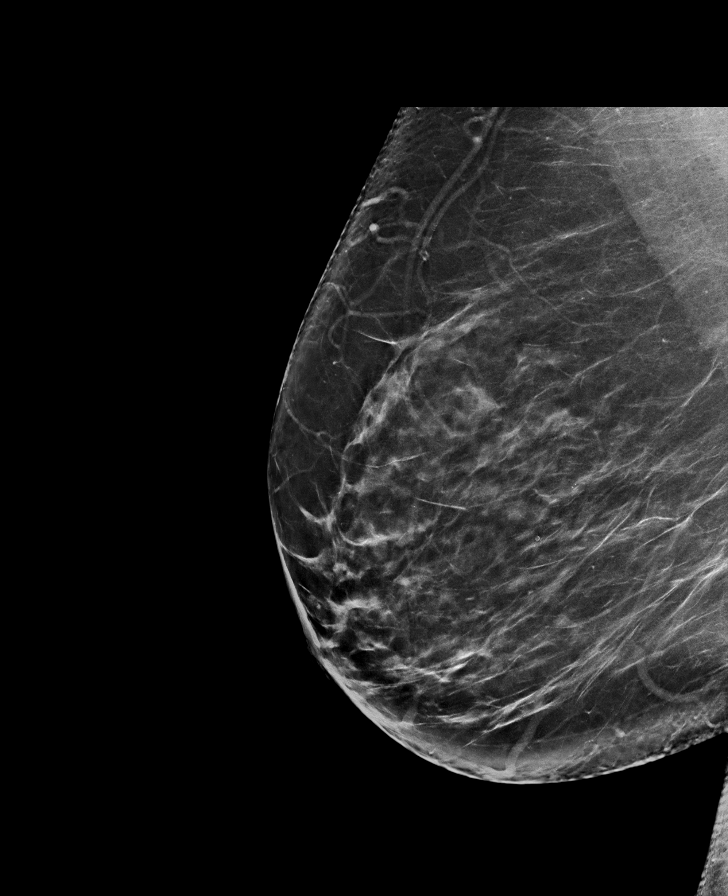

[R CC synth-2D]
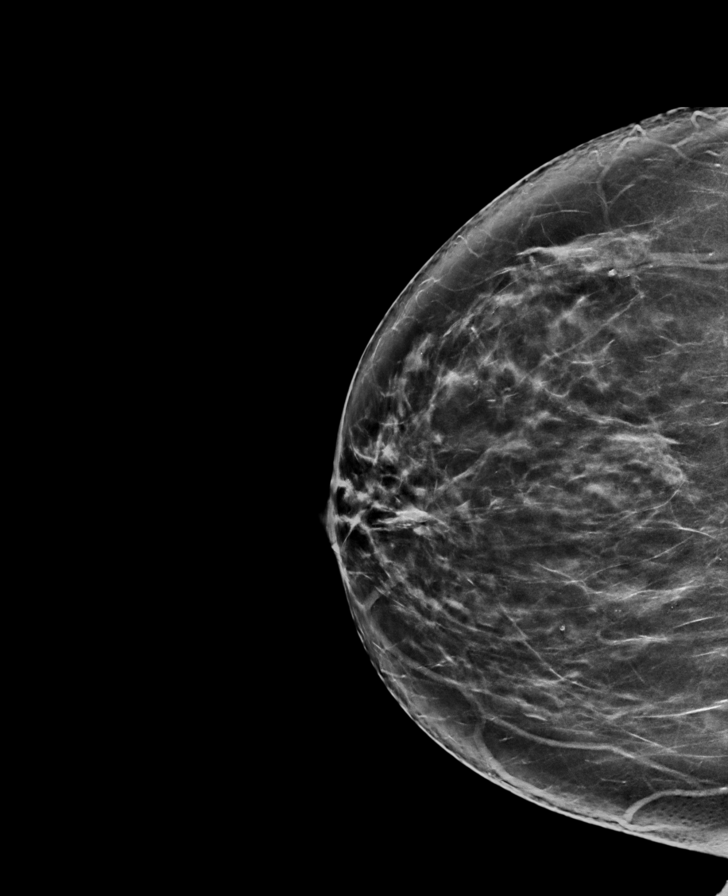

[L MLO tomo · tomo slice 47/92.0]
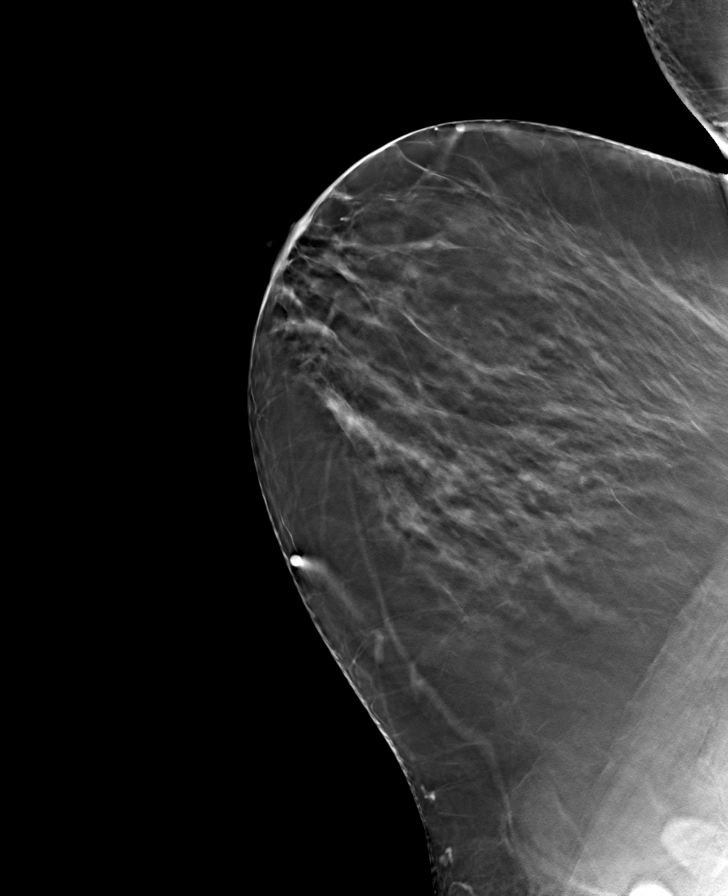

[R CC tomo · tomo slice 41/80.0]
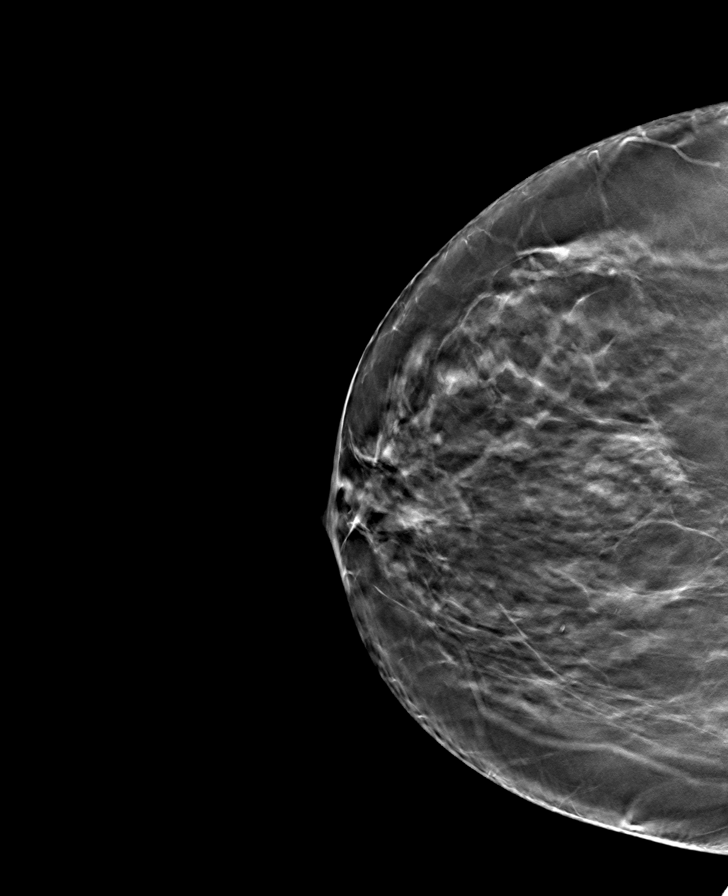

[R MLO tomo · tomo slice 47/92.0]
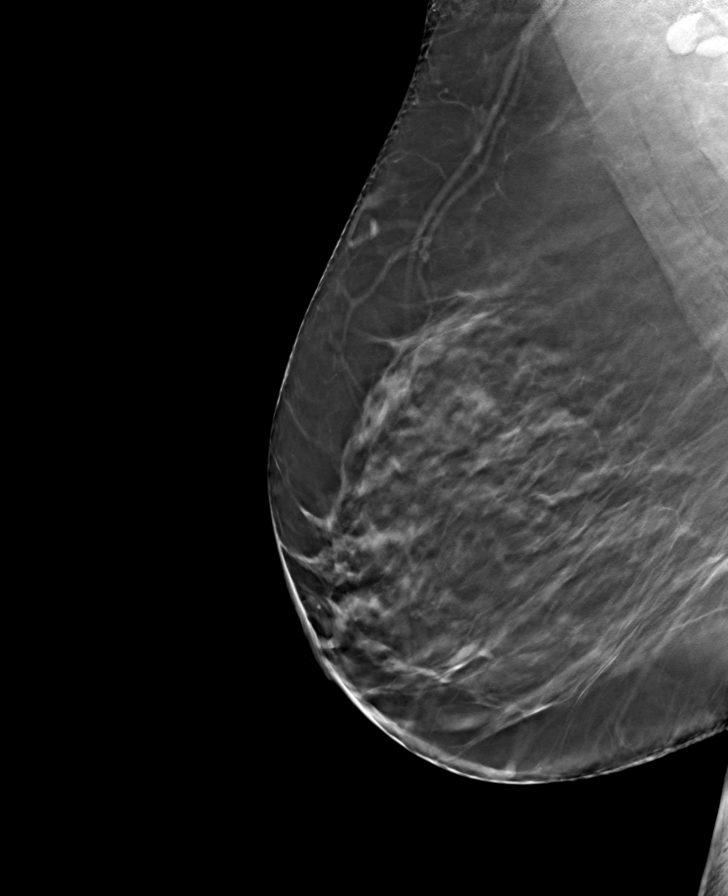

[L CC tomo · tomo slice 41/81.0]
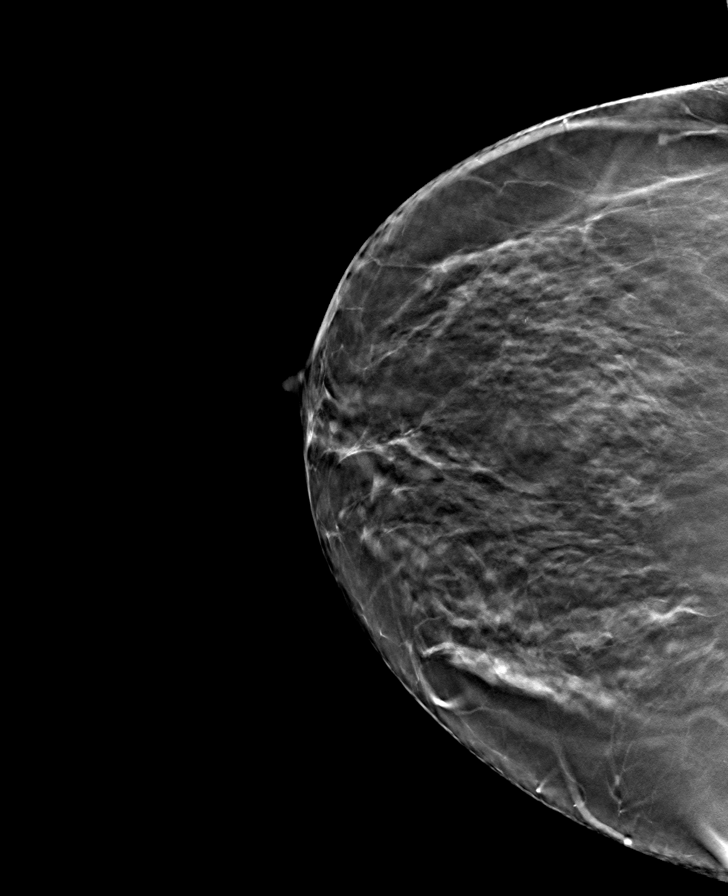

[8 of 24 positions shown; findings below may reference images not displayed]

ACR Breast Density Category b: There are scattered areas of
fibroglandular density.
FINDINGS: There are no findings suspicious for malignancy. Images were
processed with CAD.
IMPRESSION: No mammographic evidence of malignancy. A result letter of this
screening mammogram will be mailed directly to the patient.

RECOMMENDATION:
Screening mammogram in one year. (Code:[id number])

BI-RADS CATEGORY  1: Negative.

## 2019-05-26 ENCOUNTER — Ambulatory Visit: Payer: 59

## 2019-05-27 ENCOUNTER — Ambulatory Visit: Payer: 59 | Attending: Internal Medicine

## 2019-05-27 DIAGNOSIS — Z23 Encounter for immunization: Secondary | ICD-10-CM | POA: Insufficient documentation

## 2019-05-27 NOTE — Progress Notes (Signed)
   Covid-19 Vaccination Clinic  Name:  Madeline Bryant    MRN: [id number] DOB: [birth year]  05/27/2019  Madeline Bryant was observed post Covid-19 immunization for 15 minutes without incidence. She was provided with Vaccine Information Sheet and instruction to access the V-Safe system.   Madeline Bryant was instructed to call 911 with any severe reactions post vaccine: Marland Kitchen Difficulty breathing  . Swelling of your face and throat  . A fast heartbeat  . A bad rash all over your body  . Dizziness and weakness    Immunizations Administered    Name Date Dose VIS Date Route   Pfizer COVID-19 Vaccine 05/27/2019  8:27 AM 0.3 mL 03/25/2019 Intramuscular   Manufacturer: [employer], [employer]   Lot: [id number]   NDC: [address]-8

## 2019-06-17 ENCOUNTER — Ambulatory Visit: Payer: 59

## 2019-06-21 ENCOUNTER — Ambulatory Visit: Payer: 59 | Attending: Internal Medicine

## 2019-06-21 DIAGNOSIS — Z23 Encounter for immunization: Secondary | ICD-10-CM | POA: Insufficient documentation

## 2019-06-21 NOTE — Progress Notes (Signed)
   Covid-19 Vaccination Clinic  Name:  MAJA MCCAFFERY    MRN: [id number] DOB: 1965-07-29  06/21/2019  Ms. Basara was observed post Covid-19 immunization for 15 minutes without incident. She was provided with Vaccine Information Sheet and instruction to access the V-Safe system.   Ms. Early was instructed to call 911 with any severe reactions post vaccine: Marland Kitchen Difficulty breathing  . Swelling of face and throat  . A fast heartbeat  . A bad rash all over body  . Dizziness and weakness   Immunizations Administered    Name Date Dose VIS Date Route   Pfizer COVID-19 Vaccine 06/21/2019  4:34 PM 0.3 mL 03/25/2019 Intramuscular   Manufacturer: [employer], [employer]   Lot: [id number]   NDC: [address]-7

## 2021-01-14 ENCOUNTER — Encounter: Payer: Self-pay | Admitting: Family Medicine

## 2021-01-14 ENCOUNTER — Telehealth: Payer: Self-pay

## 2021-01-14 NOTE — Telephone Encounter (Signed)
Copied from CRM [id number]. Topic: Appointment Scheduling - Scheduling Inquiry for Clinic >> Jan 14, 2021  4:59 PM Madeline Bryant A wrote: Reason for CRM: Patient would like to be seen to address a concern related to bumps under their arm   Agent was unable to schedule successfully at the time of call with patient   Please contact further when available

## 2021-01-14 NOTE — Telephone Encounter (Signed)
Appointment scheduled with Dr. Sherrie Mustache 01/15/2021 at 8:20am.

## 2021-01-15 ENCOUNTER — Ambulatory Visit (INDEPENDENT_AMBULATORY_CARE_PROVIDER_SITE_OTHER): Payer: 59 | Admitting: Family Medicine

## 2021-01-15 ENCOUNTER — Other Ambulatory Visit: Payer: Self-pay

## 2021-01-15 ENCOUNTER — Encounter: Payer: Self-pay | Admitting: Family Medicine

## 2021-01-15 VITALS — BP 135/87 | HR 96 | Temp 98.5°F | Wt 249.8 lb

## 2021-01-15 DIAGNOSIS — L732 Hidradenitis suppurativa: Secondary | ICD-10-CM | POA: Diagnosis not present

## 2021-01-15 MED ORDER — DOXYCYCLINE HYCLATE 100 MG PO TABS: 100.0000 mg | ORAL_TABLET | Freq: Two times a day (BID) | ORAL | 0 refills | Status: DC

## 2021-01-15 MED ORDER — FLUCONAZOLE 150 MG PO TABS: 150.0000 mg | ORAL_TABLET | Freq: Once | ORAL | 0 refills | Status: AC

## 2021-01-15 NOTE — Patient Instructions (Addendum)
Merita Norton, NP and Alfredia Ferguson, PA-C are now accepting new patients at [facility]. You can call [facility] at [phone] or send a MyChart message to schedule a follow up appointment with Ms. Suzie Portela or Ms. Drubel.    Please call the [facility] [phone]) to schedule a routine screening mammogram.

## 2021-01-15 NOTE — Progress Notes (Signed)
      Established patient visit   Patient: Madeline Bryant   DOB: Oct 31, 1965   55 y.o. Female  MRN: [id number] Visit Date: 01/15/2021  Today's healthcare provider: Mila Merry, MD   Chief Complaint  Patient presents with   axillary lesions   Subjective    HPI  Axillary skin lesions: Patient complains of red painful bumps under her right arm. It started out as on single bump 1 month ago which became infected and drained, then resolved. Since then 3 more bumps have appeared. Associated symptoms includes redness, pain and swelling under the right armpit.     Medications: Outpatient Medications Prior to Visit  Medication Sig Note   Multiple Vitamin (MULTIVITAMIN) tablet Take 1 tablet by mouth daily.    [DISCONTINUED] rOPINIRole (REQUIP) 1 MG tablet TAKE 1 TABLET BY MOUTH AT BEDTIME (Patient not taking: Reported on 01/15/2021) 01/15/2021: patient stopped last year   No facility-administered medications prior to visit.    Review of Systems  Constitutional:  Negative for appetite change, chills, fatigue and fever.  Respiratory:  Negative for chest tightness and shortness of breath.   Cardiovascular:  Negative for chest pain and palpitations.  Gastrointestinal:  Negative for abdominal pain, nausea and vomiting.  Skin:  Positive for color change (redness).  Neurological:  Negative for dizziness and weakness.      Objective    BP 135/87 (BP Location: Left Arm)   Pulse 96   Temp 98.5 F (36.9 C) (Temporal)   Wt 249 lb 12.8 oz (113.3 kg)   SpO2 99% Comment: room air  BMI 37.98 kg/m  {Show previous vital signs (optional):23777}  Physical Exam  Three inflamed tender nodules from pea to grape sized if right axillae. No open wounds or discharge.    Assessment & Plan     1. Hidradenitis suppurativa  - fluconazole (DIFLUCAN) 150 MG tablet; Take 1 tablet (150 mg total) by mouth once for 1 dose.  Dispense: 1 tablet; Refill: 0 - doxycycline (VIBRA-TABS) 100 MG tablet; Take 1  tablet (100 mg total) by mouth 2 (two) times daily for 10 days.  Dispense: 20 tablet; Refill: 0   Call if symptoms change or if not rapidly improving.         The entirety of the information documented in the History of Present Illness, Review of Systems and Physical Exam were personally obtained by me. Portions of this information were initially documented by the CMA and reviewed by me for thoroughness and accuracy.     Mila Merry, MD  [facility] [phone] (phone) [phone] (fax)  [facility]

## 2021-02-04 ENCOUNTER — Encounter: Payer: Self-pay | Admitting: Family Medicine

## 2021-02-04 DIAGNOSIS — L732 Hidradenitis suppurativa: Secondary | ICD-10-CM

## 2021-02-04 MED ORDER — DOXYCYCLINE HYCLATE 100 MG PO TABS: 100.0000 mg | ORAL_TABLET | Freq: Two times a day (BID) | ORAL | 0 refills | Status: AC

## 2021-02-08 ENCOUNTER — Encounter: Payer: Self-pay | Admitting: Family Medicine

## 2021-02-08 ENCOUNTER — Other Ambulatory Visit: Payer: Self-pay

## 2021-02-08 ENCOUNTER — Ambulatory Visit (INDEPENDENT_AMBULATORY_CARE_PROVIDER_SITE_OTHER): Payer: 59 | Admitting: Family Medicine

## 2021-02-08 VITALS — BP 124/94 | HR 83 | Temp 97.8°F | Ht 68.0 in | Wt 249.0 lb

## 2021-02-08 DIAGNOSIS — E782 Mixed hyperlipidemia: Secondary | ICD-10-CM | POA: Insufficient documentation

## 2021-02-08 DIAGNOSIS — R7303 Prediabetes: Secondary | ICD-10-CM | POA: Diagnosis not present

## 2021-02-08 DIAGNOSIS — Z1211 Encounter for screening for malignant neoplasm of colon: Secondary | ICD-10-CM | POA: Diagnosis not present

## 2021-02-08 DIAGNOSIS — Z0000 Encounter for general adult medical examination without abnormal findings: Secondary | ICD-10-CM | POA: Diagnosis not present

## 2021-02-08 DIAGNOSIS — Z1231 Encounter for screening mammogram for malignant neoplasm of breast: Secondary | ICD-10-CM | POA: Diagnosis not present

## 2021-02-08 DIAGNOSIS — Z6837 Body mass index (BMI) 37.0-37.9, adult: Secondary | ICD-10-CM

## 2021-02-08 DIAGNOSIS — E669 Obesity, unspecified: Secondary | ICD-10-CM | POA: Insufficient documentation

## 2021-02-08 NOTE — Assessment & Plan Note (Signed)
Recommend low carb diet °Recheck A1c  °

## 2021-02-08 NOTE — Assessment & Plan Note (Signed)
Reviewed last lipid panel Not currently on a statin Recheck FLP and CMP Discussed diet and exercise  

## 2021-02-08 NOTE — Progress Notes (Signed)
Complete physical exam   Patient: Madeline Bryant   DOB: 21-Feb-1966   55 y.o. Female  MRN: [id number] Visit Date: 02/08/2021  Today's healthcare provider: Shirlee Latch, MD   Chief Complaint  Patient presents with   Annual Exam   Subjective    Madeline Bryant is a 55 y.o. female who presents today for a complete physical exam.  She reports consuming a general, low sodium, and no caffeine  diet.  Patient reports doing "hasfit" 5 days weekly for 30 minutes  She generally feels well. She reports sleeping well. She does not have additional problems to discuss today.  HPI  -Patient reports receiving flu vaccine and shingles at [facility] in [place] on Tuesday.   History reviewed. No pertinent past medical history. Past Surgical History:  Procedure Laterality Date   NO PAST SURGERIES     Social History   Socioeconomic History   Marital status: Married    Spouse name: Not on file   Number of children: Not on file   Years of education: Not on file   Highest education level: Not on file  Occupational History   Not on file  Tobacco Use   Smoking status: Never   Smokeless tobacco: Never  Substance and Sexual Activity   Alcohol use: Never   Drug use: Never   Sexual activity: Not on file  Other Topics Concern   Not on file  Social History Narrative   Not on file   Social Determinants of Health   Financial Resource Strain: Not on file  Food Insecurity: Not on file  Transportation Needs: Not on file  Physical Activity: Not on file  Stress: Not on file  Social Connections: Not on file  Intimate Partner Violence: Not on file   Family Status  Relation Name Status   Mother  Alive   Father  Deceased at age 37       causes of Death: CHF   Brother  Deceased   Neg Hx  (Not Specified)   Family History  Problem Relation Age of Onset   Hypertension Mother    Hypertension Father    Prostate cancer Father    COPD Father    Kidney failure Father    Congestive Heart  Failure Father    Esophageal cancer Brother    Breast cancer Neg Hx    No Known Allergies  Patient Care Team: Jacky Kindle, FNP as PCP - General (Family Medicine)   Medications: Outpatient Medications Prior to Visit  Medication Sig   doxycycline (VIBRA-TABS) 100 MG tablet Take 1 tablet (100 mg total) by mouth 2 (two) times daily for 10 days.   Multiple Vitamin (MULTIVITAMIN) tablet Take 1 tablet by mouth daily.   No facility-administered medications prior to visit.    Review of Systems  All other systems reviewed and are negative.  Last CBC Lab Results  Component Value Date   WBC 7.4 02/02/2019   HGB 15.2 02/02/2019   HCT 45.7 02/02/2019   MCV 86 02/02/2019   MCH 28.5 02/02/2019   RDW 13.5 02/02/2019   PLT 241 02/02/2019   Last metabolic panel Lab Results  Component Value Date   GLUCOSE 102 (H) 02/02/2019   NA 140 02/02/2019   K 4.2 02/02/2019   CL 102 02/02/2019   CO2 24 02/02/2019   BUN 13 02/02/2019   CREATININE 0.68 02/02/2019   GFRNONAA 99 02/02/2019   CALCIUM 9.6 02/02/2019   PROT 7.2 02/02/2019  ALBUMIN 4.4 02/02/2019   LABGLOB 2.8 02/02/2019   AGRATIO 1.6 02/02/2019   BILITOT 0.3 02/02/2019   ALKPHOS 98 02/02/2019   AST 31 02/02/2019   ALT 37 (H) 02/02/2019   Last lipids Lab Results  Component Value Date   CHOL 197 02/02/2019   HDL 59 02/02/2019   LDLCALC 112 (H) 02/02/2019   TRIG 149 02/02/2019   CHOLHDL 3.3 09/24/2017   Last hemoglobin A1c Lab Results  Component Value Date   HGBA1C 5.8 (H) 02/02/2019   Last thyroid functions Lab Results  Component Value Date   TSH 2.300 02/02/2019   Last vitamin D No results found for: 25OHVITD2, 25OHVITD3, VD25OH Last vitamin B12 and Folate No results found for: VITAMINB12, FOLATE    Objective    BP (!) 149/95 (BP Location: Left Arm, Patient Position: Sitting, Cuff Size: Large)   Pulse 83   Temp 97.8 F (36.6 C) (Oral)   Ht 5\' 8"  (1.727 m)   Wt 249 lb (112.9 kg)   SpO2 100%   BMI 37.86  kg/m  BP Readings from Last 3 Encounters:  02/08/21 (!) 149/95  01/15/21 135/87  02/02/19 138/84   Wt Readings from Last 3 Encounters:  02/08/21 249 lb (112.9 kg)  01/15/21 249 lb 12.8 oz (113.3 kg)  02/02/19 252 lb (114.3 kg)      Physical Exam Vitals reviewed.  Constitutional:      General: She is not in acute distress.    Appearance: Normal appearance. She is well-developed. She is not diaphoretic.  HENT:     Head: Normocephalic and atraumatic.     Right Ear: Tympanic membrane, ear canal and external ear normal.     Left Ear: Tympanic membrane, ear canal and external ear normal.     Nose: Nose normal.     Mouth/Throat:     Mouth: Mucous membranes are moist.     Pharynx: Oropharynx is clear. No oropharyngeal exudate.  Eyes:     General: No scleral icterus.    Conjunctiva/sclera: Conjunctivae normal.     Pupils: Pupils are equal, round, and reactive to light.  Neck:     Thyroid: No thyromegaly.  Cardiovascular:     Rate and Rhythm: Normal rate and regular rhythm.     Pulses: Normal pulses.     Heart sounds: Normal heart sounds. No murmur heard. Pulmonary:     Effort: Pulmonary effort is normal. No respiratory distress.     Breath sounds: Normal breath sounds. No wheezing or rales.  Abdominal:     General: There is no distension.     Palpations: Abdomen is soft.     Tenderness: There is no abdominal tenderness.  Musculoskeletal:        General: No deformity.     Cervical back: Neck supple.     Right lower leg: No edema.     Left lower leg: No edema.  Lymphadenopathy:     Cervical: No cervical adenopathy.  Skin:    General: Skin is warm and dry.     Findings: No rash.  Neurological:     Mental Status: She is alert and oriented to person, place, and time. Mental status is at baseline.     Sensory: No sensory deficit.     Motor: No weakness.     Gait: Gait normal.  Psychiatric:        Mood and Affect: Mood normal.        Behavior: Behavior normal.  Thought Content: Thought content normal.      Last depression screening scores PHQ 2/9 Scores 02/08/2021 02/02/2019 09/22/2017  PHQ - 2 Score 0 0 0  PHQ- 9 Score 0 0 0   Last fall risk screening Fall Risk  02/08/2021  Falls in the past year? 0  Number falls in past yr: 0  Injury with Fall? 0  Risk for fall due to : No Fall Risks  Follow up -   Last Audit-C alcohol use screening Alcohol Use Disorder Test (AUDIT) 02/08/2021  1. How often do you have a drink containing alcohol? 0  2. How many drinks containing alcohol do you have on a typical day when you are drinking? 0  3. How often do you have six or more drinks on one occasion? 0  AUDIT-C Score 0  Alcohol Brief Interventions/Follow-up -   A score of 3 or more in women, and 4 or more in men indicates increased risk for alcohol abuse, EXCEPT if all of the points are from question 1   No results found for any visits on 02/08/21.  Assessment & Plan    Routine Health Maintenance and Physical Exam  Exercise Activities and Dietary recommendations  Goals   None     Immunization History  Administered Date(s) Administered   Influenza,inj,Quad PF,6+ Mos 01/12/2019   Influenza-Unspecified 02/05/2021   PFIZER(Purple Top)SARS-COV-2 Vaccination 05/27/2019, 06/21/2019   Zoster Recombinat (Shingrix) 11/06/2020    Health Maintenance  Topic Date Due   TETANUS/TDAP  Never done   COVID-19 Vaccine (3 - Booster for Pfizer series) 08/16/2019   Fecal DNA (Cologuard)  10/18/2020   Zoster Vaccines- Shingrix (2 of 2) 01/01/2021   MAMMOGRAM  04/25/2021   PAP SMEAR-Modifier  12/19/2022   INFLUENZA VACCINE  Completed   Hepatitis C Screening  Completed   HIV Screening  Completed   Pneumococcal Vaccine 71-40 Years old  Aged Out   HPV VACCINES  Aged Out    Discussed health benefits of physical activity, and encouraged her to engage in regular exercise appropriate for her age and condition.  Problem List Items Addressed This Visit        Other   Moderate mixed hyperlipidemia not requiring statin therapy    Reviewed last lipid panel Not currently on a statin Recheck FLP and CMP Discussed diet and exercise       Relevant Orders   Comprehensive metabolic panel   Lipid panel   Prediabetes    Recommend low carb diet Recheck A1c      Relevant Orders   Hemoglobin A1c   Class 2 obesity without serious comorbidity with body mass index (BMI) of 37.0 to 37.9 in adult    Discussed importance of healthy weight management Discussed diet and exercise       Other Visit Diagnoses     Encounter for annual physical exam    -  Primary   Relevant Orders   Cologuard   MM 3D SCREEN BREAST BILATERAL   Comprehensive metabolic panel   Lipid panel   Hemoglobin A1c   Colon cancer screening       Relevant Orders   Cologuard   Screening mammogram for breast cancer       Relevant Orders   MM 3D SCREEN BREAST BILATERAL        Return in about 1 year (around 02/08/2022) for CPE, With new PCP.     I, Shirlee Latch, MD, have reviewed all documentation for this visit. The documentation on 02/08/21  for the exam, diagnosis, procedures, and orders are all accurate and complete.   Dariel Pellecchia, Dionne Bucy, MD, MPH [facility]

## 2021-02-08 NOTE — Assessment & Plan Note (Signed)
Discussed importance of healthy weight management Discussed diet and exercise  

## 2021-02-09 LAB — COMPREHENSIVE METABOLIC PANEL
ALT: 27 IU/L (ref 0–32)
AST: 27 IU/L (ref 0–40)
Albumin/Globulin Ratio: 1.6 (ref 1.2–2.2)
Albumin: 4.5 g/dL (ref 3.8–4.9)
Alkaline Phosphatase: 91 IU/L (ref 44–121)
BUN/Creatinine Ratio: 14 (ref 9–23)
BUN: 11 mg/dL (ref 6–24)
Bilirubin Total: 0.4 mg/dL (ref 0.0–1.2)
CO2: 24 mmol/L (ref 20–29)
Calcium: 9.6 mg/dL (ref 8.7–10.2)
Chloride: 103 mmol/L (ref 96–106)
Creatinine, Ser: 0.76 mg/dL (ref 0.57–1.00)
Globulin, Total: 2.9 g/dL (ref 1.5–4.5)
Glucose: 102 mg/dL — ABNORMAL HIGH (ref 70–99)
Potassium: 4.6 mmol/L (ref 3.5–5.2)
Sodium: 140 mmol/L (ref 134–144)
Total Protein: 7.4 g/dL (ref 6.0–8.5)
eGFR: 91 mL/min/{1.73_m2} (ref >59)

## 2021-02-09 LAB — LIPID PANEL
Chol/HDL Ratio: 3.5 ratio (ref 0.0–4.4)
Cholesterol, Total: 190 mg/dL (ref 100–199)
HDL: 54 mg/dL (ref >39)
LDL Chol Calc (NIH): 122 mg/dL — ABNORMAL HIGH (ref 0–99)
Triglycerides: 74 mg/dL (ref 0–149)
VLDL Cholesterol Cal: 14 mg/dL (ref 5–40)

## 2021-02-09 LAB — HEMOGLOBIN A1C
Est. average glucose Bld gHb Est-mCnc: 123 mg/dL
Hgb A1c MFr Bld: 5.9 % — ABNORMAL HIGH (ref 4.8–5.6)

## 2021-02-23 LAB — COLOGUARD: COLOGUARD: NEGATIVE

## 2021-03-28 ENCOUNTER — Other Ambulatory Visit: Payer: Self-pay

## 2021-03-28 ENCOUNTER — Ambulatory Visit
Admission: RE | Admit: 2021-03-28 | Discharge: 2021-03-28 | Disposition: A | Discharge status: Home / Self Care | Payer: 59 | Source: Ambulatory Visit | Attending: Family Medicine | Admitting: Family Medicine

## 2021-03-28 DIAGNOSIS — Z1231 Encounter for screening mammogram for malignant neoplasm of breast: Secondary | ICD-10-CM | POA: Insufficient documentation

## 2021-03-28 DIAGNOSIS — Z0000 Encounter for general adult medical examination without abnormal findings: Secondary | ICD-10-CM

## 2021-04-01 ENCOUNTER — Other Ambulatory Visit: Payer: Self-pay | Admitting: Family Medicine

## 2021-04-01 DIAGNOSIS — R928 Other abnormal and inconclusive findings on diagnostic imaging of breast: Secondary | ICD-10-CM

## 2021-04-01 DIAGNOSIS — R921 Mammographic calcification found on diagnostic imaging of breast: Secondary | ICD-10-CM

## 2021-04-09 ENCOUNTER — Ambulatory Visit: Payer: 59

## 2021-04-11 ENCOUNTER — Other Ambulatory Visit: Payer: Self-pay | Admitting: Family Medicine

## 2021-04-11 ENCOUNTER — Other Ambulatory Visit: Payer: Self-pay

## 2021-04-11 ENCOUNTER — Ambulatory Visit
Admission: RE | Admit: 2021-04-11 | Discharge: 2021-04-11 | Disposition: A | Discharge status: Home / Self Care | Payer: 59 | Source: Ambulatory Visit | Attending: Family Medicine | Admitting: Family Medicine

## 2021-04-11 DIAGNOSIS — R921 Mammographic calcification found on diagnostic imaging of breast: Secondary | ICD-10-CM | POA: Diagnosis present

## 2021-04-11 DIAGNOSIS — R928 Other abnormal and inconclusive findings on diagnostic imaging of breast: Secondary | ICD-10-CM | POA: Diagnosis present

## 2021-04-12 ENCOUNTER — Encounter: Payer: Self-pay | Admitting: Family Medicine

## 2021-04-16 NOTE — Telephone Encounter (Signed)
Please review and revise your result of the abnormal mammogram.

## 2021-04-17 ENCOUNTER — Other Ambulatory Visit: Payer: Self-pay

## 2021-04-17 ENCOUNTER — Ambulatory Visit
Admission: RE | Admit: 2021-04-17 | Discharge: 2021-04-17 | Disposition: A | Discharge status: Home / Self Care | Payer: 59 | Source: Ambulatory Visit | Attending: Family Medicine | Admitting: Family Medicine

## 2021-04-17 DIAGNOSIS — R928 Other abnormal and inconclusive findings on diagnostic imaging of breast: Secondary | ICD-10-CM | POA: Diagnosis present

## 2021-04-17 DIAGNOSIS — R921 Mammographic calcification found on diagnostic imaging of breast: Secondary | ICD-10-CM

## 2021-04-17 HISTORY — PX: BREAST BIOPSY: SHX20

## 2021-04-18 LAB — SURGICAL PATHOLOGY

## 2021-06-20 ENCOUNTER — Ambulatory Visit: Payer: 59 | Admitting: Dermatology

## 2021-07-18 ENCOUNTER — Ambulatory Visit: Payer: 59 | Admitting: Dermatology

## 2021-07-18 DIAGNOSIS — L409 Psoriasis, unspecified: Secondary | ICD-10-CM

## 2021-07-18 MED ORDER — TRIAMCINOLONE ACETONIDE 0.1 % EX CREA: TOPICAL_CREAM | CUTANEOUS | 3 refills | Status: DC

## 2021-07-18 NOTE — Patient Instructions (Addendum)
° °Topical steroids (such as triamcinolone, fluocinolone, fluocinonide, mometasone, clobetasol, halobetasol, betamethasone, hydrocortisone) can cause thinning and lightening of the skin if they are used for too long in the same area. Your physician has selected the right strength medicine for your problem and area affected on the body. Please use your medication only as directed by your physician to prevent side effects.  ° ° °If You Need Anything After Your Visit ° °If you have any questions or concerns for your doctor, please call our main line at [phone] and press option 4 to reach your doctor's medical assistant. If no one answers, please leave a voicemail as directed and we will return your call as soon as possible. Messages left after 4 pm will be answered the following business day.  ° °You may also send us a message via MyChart. We typically respond to MyChart messages within 1-2 business days. ° °For prescription refills, please ask your pharmacy to contact our office. Our fax number is [phone]. ° °If you have an urgent issue when the clinic is closed that cannot wait until the next business day, you can page your doctor at the number below.   ° °Please note that while we do our best to be available for urgent issues outside of office hours, we are not available 24/7.  ° °If you have an urgent issue and are unable to reach us, you may choose to seek medical care at your doctor's office, retail clinic, urgent care center, or emergency room. ° °If you have a medical emergency, please immediately call 911 or go to the emergency department. ° °Pager Numbers ° °- Dr. Kowalski: [phone] ° °- Dr. Moye: [phone] ° °- Dr. Stewart: [phone] ° °In the event of inclement weather, please call our main line at [phone] for an update on the status of any delays or closures. ° °Dermatology Medication Tips: °Please keep the boxes that topical medications come in in order to help keep track of the  instructions about where and how to use these. Pharmacies typically print the medication instructions only on the boxes and not directly on the medication tubes.  ° °If your medication is too expensive, please contact our office at [phone] option 4 or send us a message through MyChart.  ° °We are unable to tell what your co-pay for medications will be in advance as this is different depending on your insurance coverage. However, we may be able to find a substitute medication at lower cost or fill out paperwork to get insurance to cover a needed medication.  ° °If a prior authorization is required to get your medication covered by your insurance company, please allow us 1-2 business days to complete this process. ° °Drug prices often vary depending on where the prescription is filled and some pharmacies may offer cheaper prices. ° °The website www.goodrx.com contains coupons for medications through different pharmacies. The prices here do not account for what the cost may be with help from insurance (it may be cheaper with your insurance), but the website can give you the price if you did not use any insurance.  °- You can print the associated coupon and take it with your prescription to the pharmacy.  °- You may also stop by our office during regular business hours and pick up a GoodRx coupon card.  °- If you need your prescription sent electronically to a different pharmacy, notify our office through [facility] MyChart or by phone at [phone] option 4. ° ° ° ° °  Si Usted Necesita Algo Después de Su Visita ° °También puede enviarnos un mensaje a través de MyChart. Por lo general respondemos a los mensajes de MyChart en el transcurso de 1 a 2 días hábiles. ° °Para renovar recetas, por favor pida a su farmacia que se ponga en contacto con nuestra oficina. Nuestro número de fax es el [phone]. ° °Si tiene un asunto urgente cuando la clínica esté cerrada y que no puede esperar hasta el siguiente día hábil,  puede llamar/localizar a su doctor(a) al número que aparece a continuación.  ° °Por favor, tenga en cuenta que aunque hacemos todo lo posible para estar disponibles para asuntos urgentes fuera del horario de oficina, no estamos disponibles las 24 horas del día, los 7 días de la semana.  ° °Si tiene un problema urgente y no puede comunicarse con nosotros, puede optar por buscar atención médica  en el consultorio de su doctor(a), en una clínica privada, en un centro de atención urgente o en una sala de emergencias. ° °Si tiene una emergencia médica, por favor llame inmediatamente al 911 o vaya a la sala de emergencias. ° °Números de bíper ° °- Dr. Kowalski: [phone] ° °- Dra. Moye: [phone] ° °- Dra. Stewart: [phone] ° °En caso de inclemencias del tiempo, por favor llame a nuestra línea principal al [phone] para una actualización sobre el estado de cualquier retraso o cierre. ° °Consejos para la medicación en dermatología: °Por favor, guarde las cajas en las que vienen los medicamentos de uso tópico para ayudarle a seguir las instrucciones sobre dónde y cómo usarlos. Las farmacias generalmente imprimen las instrucciones del medicamento sólo en las cajas y no directamente en los tubos del medicamento.  ° °Si su medicamento es muy caro, por favor, póngase en contacto con nuestra oficina llamando al [phone] y presione la opción 4 o envíenos un mensaje a través de MyChart.  ° °No podemos decirle cuál será su copago por los medicamentos por adelantado ya que esto es diferente dependiendo de la cobertura de su seguro. Sin embargo, es posible que podamos encontrar un medicamento sustituto a menor costo o llenar un formulario para que el seguro cubra el medicamento que se considera necesario.  ° °Si se requiere una autorización previa para que su compañía de seguros cubra su medicamento, por favor permítanos de 1 a 2 días hábiles para completar este proceso. ° °Los precios de los medicamentos varían con  frecuencia dependiendo del lugar de dónde se surte la receta y alguna farmacias pueden ofrecer precios más baratos. ° °El sitio web www.goodrx.com tiene cupones para medicamentos de diferentes farmacias. Los precios aquí no tienen en cuenta lo que podría costar con la ayuda del seguro (puede ser más barato con su seguro), pero el sitio web puede darle el precio si no utilizó ningún seguro.  °- Puede imprimir el cupón correspondiente y llevarlo con su receta a la farmacia.  °- También puede pasar por nuestra oficina durante el horario de atención regular y recoger una tarjeta de cupones de GoodRx.  °- Si necesita que su receta se envíe electrónicamente a una farmacia diferente, informe a nuestra oficina a través de MyChart de [facility] o por teléfono llamando al [phone] y presione la opción 4. ° °

## 2021-07-18 NOTE — Progress Notes (Signed)
? ?  New Patient Visit ? ?Subjective  ?Madeline Bryant is a 56 y.o. female who presents for the following: Skin Problem (Pt c/o psoriasis on her legs for 1 year, treating with Hydrocortisone with a poor response). ? ?The following portions of the chart were reviewed this encounter and updated as appropriate:  ? Tobacco  Allergies  Meds  Problems  Med Hx  Surg Hx  Fam Hx   ?  ?Review of Systems:  No other skin or systemic complaints except as noted in HPI or Assessment and Plan. ? ?Objective  ?Well appearing patient in no apparent distress; mood and affect are within normal limits. ? ?A focused examination was performed including bilateral legs. Relevant physical exam findings are noted in the Assessment and Plan. ? ?bilateral lower legs ?Well-demarcated erythematous papules/plaques with silvery scale, guttate pink scaly papules.  ? ? ?Assessment & Plan  ?Psoriasis ?bilateral lower legs ? ?Psoriasis is a chronic non-curable, but treatable genetic/hereditary disease that may have other systemic features affecting other organ systems such as joints (Psoriatic Arthritis). It is associated with an increased risk of inflammatory bowel disease, heart disease, non-alcoholic fatty liver disease, and depression.    ? ?Start Triamcinolone 0.1% cream apply to affected skin twice a day for 2 weeks, then decrease to 5 nights a week ? ?Topical steroids (such as triamcinolone, fluocinolone, fluocinonide, mometasone, clobetasol, halobetasol, betamethasone, hydrocortisone) can cause thinning and lightening of the skin if they are used for too long in the same area. Your physician has selected the right strength medicine for your problem and area affected on the body. Please use your medication only as directed by your physician to prevent side effects.   ? ?Related Medications ?triamcinolone cream (KENALOG) 0.1 % ?apply twice daily as needed to affected areas for two weeks then apply 5 days a week ? ? ?Return in about 3 months  (around 10/17/2021) for Psoriasis . ? ?I, Angelique Holm, CMA, am acting as scribe for Armida Sans, MD .  ?Documentation: I have reviewed the above documentation for accuracy and completeness, and I agree with the above. ? ?Armida Sans, MD ? ?

## 2021-07-21 ENCOUNTER — Encounter: Payer: Self-pay | Admitting: Dermatology

## 2021-08-18 ENCOUNTER — Encounter: Payer: Self-pay | Admitting: Dermatology

## 2021-10-24 ENCOUNTER — Ambulatory Visit: Payer: 59 | Admitting: Dermatology

## 2021-10-24 DIAGNOSIS — L408 Other psoriasis: Secondary | ICD-10-CM | POA: Diagnosis not present

## 2021-10-24 DIAGNOSIS — L409 Psoriasis, unspecified: Secondary | ICD-10-CM

## 2021-10-24 MED ORDER — VTAMA 1 % EX CREA: TOPICAL_CREAM | CUTANEOUS | 2 refills | Status: DC

## 2021-10-24 NOTE — Progress Notes (Signed)
   Follow-Up Visit   Subjective  DELENA CASEBEER is a 56 y.o. female who presents for the following: Psoriasis (3 months f/u psoriasis on her legs, treating with Triamcinolone cream patient report little improvement noticed. ).   The following portions of the chart were reviewed this encounter and updated as appropriate:   Tobacco  Allergies  Meds  Problems  Med Hx  Surg Hx  Fam Hx      Review of Systems:  No other skin or systemic complaints except as noted in HPI or Assessment and Plan.  Objective  Well appearing patient in no apparent distress; mood and affect are within normal limits.  A focused examination was performed including legs,arms,elbows. Relevant physical exam findings are noted in the Assessment and Plan.  lower legs, elbows Well-demarcated erythematous papules/plaques with silvery scale, guttate pink scaly papules.    Assessment & Plan  Psoriasis lower legs, elbows  Psoriasis with Psoriasis Inversa/Intertrigo  Psoriasis is a chronic non-curable, but treatable genetic/hereditary disease that may have other systemic features affecting other organ systems such as joints (Psoriatic Arthritis). It is associated with an increased risk of inflammatory bowel disease, heart disease, non-alcoholic fatty liver disease, and depression.     Start Vtama cream apply to affected skin qd  If no better on Vtama we will try Zoryve cream  May consider Otezla in the future   Patient instructed to call here after 6 weeks if no better using Vtama cream then we will try Zoryve cream.   Chronic and persistent condition with duration or expected duration over one year. Condition is symptomatic / bothersome to patient. Not to goal.  Related Medications Tapinarof (VTAMA) 1 % CREA Apply to affected skin once a day  Return in about 3 months (around 01/24/2022) for Psoriasis .  IAngelique Holm, CMA, am acting as scribe for Armida Sans, MD .  Documentation: I have reviewed  the above documentation for accuracy and completeness, and I agree with the above.  Armida Sans, MD

## 2021-10-24 NOTE — Patient Instructions (Signed)
Due to recent changes in healthcare laws, you may see results of your pathology and/or laboratory studies on MyChart before the doctors have had a chance to review them. We understand that in some cases there may be results that are confusing or concerning to you. Please understand that not all results are received at the same time and often the doctors may need to interpret multiple results in order to provide you with the best plan of care or course of treatment. Therefore, we ask that you please give us 2 business days to thoroughly review all your results before contacting the office for clarification. Should we see a critical lab result, you will be contacted sooner.   If You Need Anything After Your Visit  If you have any questions or concerns for your doctor, please call our main line at [phone] and press option 4 to reach your doctor's medical assistant. If no one answers, please leave a voicemail as directed and we will return your call as soon as possible. Messages left after 4 pm will be answered the following business day.   You may also send us a message via MyChart. We typically respond to MyChart messages within 1-2 business days.  For prescription refills, please ask your pharmacy to contact our office. Our fax number is [phone].  If you have an urgent issue when the clinic is closed that cannot wait until the next business day, you can page your doctor at the number below.    Please note that while we do our best to be available for urgent issues outside of office hours, we are not available 24/7.   If you have an urgent issue and are unable to reach us, you may choose to seek medical care at your doctor's office, retail clinic, urgent care center, or emergency room.  If you have a medical emergency, please immediately call 911 or go to the emergency department.  Pager Numbers  - Dr. Kowalski: [phone]  - Dr. Moye: [phone]  - Dr. Stewart:  [phone]  In the event of inclement weather, please call our main line at [phone] for an update on the status of any delays or closures.  Dermatology Medication Tips: Please keep the boxes that topical medications come in in order to help keep track of the instructions about where and how to use these. Pharmacies typically print the medication instructions only on the boxes and not directly on the medication tubes.   If your medication is too expensive, please contact our office at [phone] option 4 or send us a message through MyChart.   We are unable to tell what your co-pay for medications will be in advance as this is different depending on your insurance coverage. However, we may be able to find a substitute medication at lower cost or fill out paperwork to get insurance to cover a needed medication.   If a prior authorization is required to get your medication covered by your insurance company, please allow us 1-2 business days to complete this process.  Drug prices often vary depending on where the prescription is filled and some pharmacies may offer cheaper prices.  The website www.goodrx.com contains coupons for medications through different pharmacies. The prices here do not account for what the cost may be with help from insurance (it may be cheaper with your insurance), but the website can give you the price if you did not use any insurance.  - You can print the associated coupon and take it with   your prescription to the pharmacy.  - You may also stop by our office during regular business hours and pick up a GoodRx coupon card.  - If you need your prescription sent electronically to a different pharmacy, notify our office through [facility] MyChart or by phone at [phone] option 4.     Si Usted Necesita Algo Despus de Su Visita  Tambin puede enviarnos un mensaje a travs de MyChart. Por lo general respondemos a los mensajes de MyChart en el transcurso de 1 a 2  das hbiles.  Para renovar recetas, por favor pida a su farmacia que se ponga en contacto con nuestra oficina. Nuestro nmero de fax es el [phone].  Si tiene un asunto urgente cuando la clnica est cerrada y que no puede esperar hasta el siguiente da hbil, puede llamar/localizar a su doctor(a) al nmero que aparece a continuacin.   Por favor, tenga en cuenta que aunque hacemos todo lo posible para estar disponibles para asuntos urgentes fuera del horario de oficina, no estamos disponibles las 24 horas del da, los 7 das de la semana.   Si tiene un problema urgente y no puede comunicarse con nosotros, puede optar por buscar atencin mdica  en el consultorio de su doctor(a), en una clnica privada, en un centro de atencin urgente o en una sala de emergencias.  Si tiene una emergencia mdica, por favor llame inmediatamente al 911 o vaya a la sala de emergencias.  Nmeros de bper  - Dr. Kowalski: [phone]  - Dra. Moye: [phone]  - Dra. Stewart: [phone]  En caso de inclemencias del tiempo, por favor llame a nuestra lnea principal al [phone] para una actualizacin sobre el estado de cualquier retraso o cierre.  Consejos para la medicacin en dermatologa: Por favor, guarde las cajas en las que vienen los medicamentos de uso tpico para ayudarle a seguir las instrucciones sobre dnde y cmo usarlos. Las farmacias generalmente imprimen las instrucciones del medicamento slo en las cajas y no directamente en los tubos del medicamento.   Si su medicamento es muy caro, por favor, pngase en contacto con nuestra oficina llamando al [phone] y presione la opcin 4 o envenos un mensaje a travs de MyChart.   No podemos decirle cul ser su copago por los medicamentos por adelantado ya que esto es diferente dependiendo de la cobertura de su seguro. Sin embargo, es posible que podamos encontrar un medicamento sustituto a menor costo o llenar un formulario para que el  seguro cubra el medicamento que se considera necesario.   Si se requiere una autorizacin previa para que su compaa de seguros cubra su medicamento, por favor permtanos de 1 a 2 das hbiles para completar este proceso.  Los precios de los medicamentos varan con frecuencia dependiendo del lugar de dnde se surte la receta y alguna farmacias pueden ofrecer precios ms baratos.  El sitio web www.goodrx.com tiene cupones para medicamentos de diferentes farmacias. Los precios aqu no tienen en cuenta lo que podra costar con la ayuda del seguro (puede ser ms barato con su seguro), pero el sitio web puede darle el precio si no utiliz ningn seguro.  - Puede imprimir el cupn correspondiente y llevarlo con su receta a la farmacia.  - Tambin puede pasar por nuestra oficina durante el horario de atencin regular y recoger una tarjeta de cupones de GoodRx.  - Si necesita que su receta se enve electrnicamente a una farmacia diferente, informe a nuestra oficina a travs de MyChart de [facility]   o por telfono llamando al [phone] y presione la opcin 4.  

## 2021-10-30 ENCOUNTER — Encounter: Payer: Self-pay | Admitting: Dermatology

## 2021-11-05 ENCOUNTER — Encounter: Payer: Self-pay | Admitting: Dermatology

## 2022-02-03 ENCOUNTER — Ambulatory Visit: Payer: 59 | Admitting: Dermatology

## 2022-02-07 NOTE — Progress Notes (Signed)
Complete physical exam   Patient: Madeline Bryant   DOB: 14-Mar-1966   56 y.o. Female  MRN: [id number] Visit Date: 02/14/2022  Today's healthcare provider: Gwyneth Sprout, FNP  Patient presents for new patient visit to establish care.  Introduced to Designer, jewellery role and practice setting.  All questions answered.  Discussed provider/patient relationship and expectations.  I,Madeline Bryant,acting as a scribe for Madeline Sprout, FNP.,have documented all relevant documentation on the behalf of Madeline Sprout, FNP,as directed by  Madeline Sprout, FNP while in the presence of Madeline Sprout, FNP.   Chief Complaint  Patient presents with   Annual Exam   Subjective    Madeline Bryant is a 56 y.o. female who presents today for a complete physical exam.  She reports consuming a general diet. The patient does not participate in regular exercise at present. She generally feels well. She reports sleeping poorly. She does have additional problems to discuss today. Complains of vaginal itching at night.   HPI   No past medical history on file. Past Surgical History:  Procedure Laterality Date   BREAST BIOPSY Left 04/17/2021   Affirm bx-calcs-"RIBBON" clip-path pending   NO PAST SURGERIES     Social History   Socioeconomic History   Marital status: Married    Spouse name: Not on file   Number of children: Not on file   Years of education: Not on file   Highest education level: Not on file  Occupational History   Not on file  Tobacco Use   Smoking status: Never   Smokeless tobacco: Never  Substance and Sexual Activity   Alcohol use: Never   Drug use: Never   Sexual activity: Not on file  Other Topics Concern   Not on file  Social History Narrative   Not on file   Social Determinants of Health   Financial Resource Strain: Not on file  Food Insecurity: Not on file  Transportation Needs: Not on file  Physical Activity: Not on file  Stress: Not on file  Social  Connections: Not on file  Intimate Partner Violence: Not on file   Family Status  Relation Name Status   Mother  Deceased   Father  Deceased at age 23       causes of Death: CHF   Brother  Deceased   Neg Hx  (Not Specified)   Family History  Problem Relation Age of Onset   Hypertension Mother    Hypertension Father    Prostate cancer Father    COPD Father    Kidney failure Father    Congestive Heart Failure Father    Esophageal cancer Brother    Breast cancer Neg Hx    No Known Allergies  Patient Care Team: Madeline Sprout, FNP as PCP - General (Family Medicine)   Medications: Outpatient Medications Prior to Visit  Medication Sig   Multiple Vitamin (MULTIVITAMIN) tablet Take 1 tablet by mouth daily.   [DISCONTINUED] Tapinarof (VTAMA) 1 % CREA Apply to affected skin once a day   No facility-administered medications prior to visit.    Review of Systems   Objective    BP (!) 146/98 (BP Location: Right Arm, Patient Position: Sitting, Cuff Size: Large)   Pulse 88   Temp 97.9 F (36.6 C) (Oral)   Resp 16   Ht 5\' 8"  (1.727 m)   Wt 256 lb (116.1 kg)   BMI 38.92 kg/m   Physical Exam  Vitals and nursing note reviewed.  Constitutional:      General: She is awake. She is not in acute distress.    Appearance: Normal appearance. She is well-developed and well-groomed. She is obese. She is not ill-appearing, toxic-appearing or diaphoretic.  HENT:     Head: Normocephalic and atraumatic.     Jaw: There is normal jaw occlusion. No trismus, tenderness, swelling or pain on movement.     Right Ear: Hearing, tympanic membrane, ear canal and external ear normal. There is no impacted cerumen.     Left Ear: Hearing, tympanic membrane, ear canal and external ear normal. There is no impacted cerumen.     Nose: Nose normal. No congestion or rhinorrhea.     Right Turbinates: Not enlarged, swollen or pale.     Left Turbinates: Not enlarged, swollen or pale.     Right Sinus: No maxillary  sinus tenderness or frontal sinus tenderness.     Left Sinus: No maxillary sinus tenderness or frontal sinus tenderness.     Mouth/Throat:     Lips: Pink.     Mouth: Mucous membranes are moist. No injury.     Tongue: No lesions.     Pharynx: Oropharynx is clear. Uvula midline. No pharyngeal swelling, oropharyngeal exudate, posterior oropharyngeal erythema or uvula swelling.     Tonsils: No tonsillar exudate or tonsillar abscesses.  Eyes:     General: Lids are normal. Lids are everted, no foreign bodies appreciated. Vision grossly intact. Gaze aligned appropriately. No allergic shiner or visual field deficit.       Right eye: No discharge.        Left eye: No discharge.     Extraocular Movements: Extraocular movements intact.     Conjunctiva/sclera: Conjunctivae normal.     Right eye: Right conjunctiva is not injected. No exudate.    Left eye: Left conjunctiva is not injected. No exudate.    Pupils: Pupils are equal, round, and reactive to light.  Neck:     Thyroid: No thyroid mass, thyromegaly or thyroid tenderness.     Vascular: No carotid bruit.     Trachea: Trachea normal.  Cardiovascular:     Rate and Rhythm: Normal rate and regular rhythm.     Pulses: Normal pulses.          Carotid pulses are 2+ on the right side and 2+ on the left side.      Radial pulses are 2+ on the right side and 2+ on the left side.       Dorsalis pedis pulses are 2+ on the right side and 2+ on the left side.       Posterior tibial pulses are 2+ on the right side and 2+ on the left side.     Heart sounds: Normal heart sounds, S1 normal and S2 normal. No murmur heard.    No friction rub. No gallop.  Pulmonary:     Effort: Pulmonary effort is normal. No respiratory distress.     Breath sounds: Normal breath sounds and air entry. No stridor. No wheezing, rhonchi or rales.  Chest:     Chest wall: No tenderness.  Abdominal:     General: Abdomen is flat. Bowel sounds are normal. There is no distension.      Palpations: Abdomen is soft. There is no mass.     Tenderness: There is no abdominal tenderness. There is no right CVA tenderness, left CVA tenderness, guarding or rebound.     Hernia: No hernia  is present.  Genitourinary:    Comments: Exam deferred; denies complaints Musculoskeletal:        General: No swelling, tenderness, deformity or signs of injury. Normal range of motion.     Cervical back: Full passive range of motion without pain, normal range of motion and neck supple. No edema, rigidity or tenderness. No muscular tenderness.     Right lower leg: No edema.     Left lower leg: No edema.  Lymphadenopathy:     Cervical: No cervical adenopathy.     Right cervical: No superficial, deep or posterior cervical adenopathy.    Left cervical: No superficial, deep or posterior cervical adenopathy.  Skin:    General: Skin is warm and dry.     Capillary Refill: Capillary refill takes less than 2 seconds.     Coloration: Skin is not jaundiced or pale.     Findings: No bruising, erythema, lesion or rash.  Neurological:     General: No focal deficit present.     Mental Status: She is alert and oriented to person, place, and time. Mental status is at baseline.     GCS: GCS eye subscore is 4. GCS verbal subscore is 5. GCS motor subscore is 6.     Sensory: Sensation is intact. No sensory deficit.     Motor: Motor function is intact. No weakness.     Coordination: Coordination is intact. Coordination normal.     Gait: Gait is intact. Gait normal.  Psychiatric:        Attention and Perception: Attention and perception normal.        Mood and Affect: Mood and affect normal.        Speech: Speech normal.        Behavior: Behavior normal. Behavior is cooperative.        Thought Content: Thought content normal.        Cognition and Memory: Cognition and memory normal.        Judgment: Judgment normal.     Last depression screening scores    02/14/2022    8:46 AM 02/08/2021    8:40 AM  02/02/2019    3:15 PM  PHQ 2/9 Scores  PHQ - 2 Score 0 0 0  PHQ- 9 Score 0 0 0   Last fall risk screening    02/14/2022    8:46 AM  [place] in the past year? 0  Number falls in past yr: 0  Injury with Fall? 0  Risk for fall due to : No Fall Risks  Follow up Falls evaluation completed   Last Audit-C alcohol use screening    02/14/2022    8:47 AM  Alcohol Use Disorder Test (AUDIT)  1. How often do you have a drink containing alcohol? 0  2. How many drinks containing alcohol do you have on a typical day when you are drinking? 0  3. How often do you have six or more drinks on one occasion? 0  AUDIT-C Score 0   A score of 3 or more in women, and 4 or more in men indicates increased risk for alcohol abuse, EXCEPT if all of the points are from question 1   No results found for any visits on 02/14/22.  Assessment & Plan    Routine Health Maintenance and Physical Exam  Exercise Activities and Dietary recommendations  Goals   None     Immunization History  Administered Date(s) Administered   Influenza,inj,Quad PF,6+ Mos 01/12/2019  Influenza-Unspecified 02/05/2021, 01/16/2022   PFIZER(Purple Top)SARS-COV-2 Vaccination 05/27/2019, 06/21/2019   Tdap 02/14/2022   Zoster Recombinat (Shingrix) 11/06/2020    Health Maintenance  Topic Date Due   COVID-19 Vaccine (3 - Pfizer series) 08/16/2019   Zoster Vaccines- Shingrix (2 of 2) 01/01/2021   PAP SMEAR-Modifier  12/19/2022   MAMMOGRAM  03/29/2023   Fecal DNA (Cologuard)  02/19/2024   TETANUS/TDAP  02/15/2032   INFLUENZA VACCINE  Completed   Hepatitis C Screening  Completed   HIV Screening  Completed   HPV VACCINES  Aged Out    Discussed health benefits of physical activity, and encouraged her to engage in regular exercise appropriate for her age and condition.  Problem List Items Addressed This Visit       Cardiovascular and Mediastinum   Primary hypertension    New diagnosis, agreeable to start medication  while working on diet and exercise Goal <140/<90      Relevant Medications   losartan-hydrochlorothiazide (HYZAAR) 50-12.5 MG tablet     Genitourinary   Itching in the vaginal area    Acute, intermittent Request for topical medication to assist Denies concern for STI Denies pain with intercourse       Relevant Medications   nystatin-triamcinolone ointment (MYCOLOG)     Other   Annual physical exam - Primary    UTD on vision and dental Things to do to keep yourself healthy  - Exercise at least 30-45 minutes a day, 3-4 days a week.  - Eat a low-fat diet with lots of fruits and vegetables, up to 7-9 servings per day.  - Seatbelts can save your life. Wear them always.  - Smoke detectors on every level of your home, check batteries every year.  - Eye Doctor - have an eye exam every 1-2 years  - Safe sex - if you may be exposed to STDs, use a condom.  - Alcohol -  If you drink, do it moderately, less than 2 drinks per day.  - [place]. Choose someone to speak for you if you are not able.  - Depression is common in our stressful world.If you're feeling down or losing interest in things you normally enjoy, please come in for a visit.  - Violence - If anyone is threatening or hurting you, please call immediately.       Relevant Orders   CBC with Differential/Platelet   Comprehensive Metabolic Panel (CMET)   TSH + free T4   Hemoglobin A1c   Lipid panel   Class 2 severe obesity due to excess calories with serious comorbidity and body mass index (BMI) of 38.0 to 38.9 in adult [facility])    Chronic, worsening  Body mass index is 38.92 kg/m. Wishes to start medication to start weight loss Discussed use of orals vs injectables Agreeable to orals RTC in 3 months       Relevant Medications   phentermine 37.5 MG capsule   buPROPion (WELLBUTRIN XL) 150 MG 24 hr tablet   Other Relevant Orders   CBC with Differential/Platelet   Comprehensive Metabolic Panel (CMET)    TSH + free T4   Hemoglobin A1c   Lipid panel   Moderate mixed hyperlipidemia not requiring statin therapy    Chronic, previously stable Repeat LP I continue to recommend diet low in saturated fat and regular exercise - 30 min at least 5 times per week       Relevant Medications   losartan-hydrochlorothiazide (HYZAAR) 50-12.5 MG tablet  Other Relevant Orders   Lipid panel   Need for tetanus, diphtheria, and acellular pertussis (Tdap) vaccine    Completed; repeat in 10 years or PRN       Relevant Orders   Tdap vaccine greater than or equal to 7yo IM (Completed)   Prediabetes    Chronic, stable Repeat A1c Continue to recommend balanced, lower carb meals. Smaller meal size, adding snacks. Choosing water as drink of choice and increasing purposeful exercise.       Relevant Orders   Hemoglobin A1c   Screening for thyroid disorder    Screening for thyroid given obesity and HTN       Relevant Orders   TSH + free T4   Screening mammogram for breast cancer    Due for screening for mammogram, denies breast concerns, provided with phone number to call and schedule appointment for mammogram. Encouraged to repeat breast cancer screening every 1-2 years.       Relevant Orders   MM 3D SCREEN BREAST BILATERAL   Return in about 3 months (around 05/17/2022) for weight mgmt.    Vonna Kotyk, FNP, have reviewed all documentation for this visit. The documentation on 02/14/22 for the exam, diagnosis, procedures, and orders are all accurate and complete.  Madeline Bryant, [place] [phone] (phone) [phone] (fax)  [place]

## 2022-02-14 ENCOUNTER — Ambulatory Visit (INDEPENDENT_AMBULATORY_CARE_PROVIDER_SITE_OTHER): Payer: 59 | Admitting: Family Medicine

## 2022-02-14 ENCOUNTER — Encounter: Payer: Self-pay | Admitting: Family Medicine

## 2022-02-14 VITALS — BP 146/98 | HR 88 | Temp 97.9°F | Resp 16 | Ht 68.0 in | Wt 256.0 lb

## 2022-02-14 DIAGNOSIS — I10 Essential (primary) hypertension: Secondary | ICD-10-CM

## 2022-02-14 DIAGNOSIS — E782 Mixed hyperlipidemia: Secondary | ICD-10-CM | POA: Diagnosis not present

## 2022-02-14 DIAGNOSIS — N898 Other specified noninflammatory disorders of vagina: Secondary | ICD-10-CM | POA: Insufficient documentation

## 2022-02-14 DIAGNOSIS — Z0000 Encounter for general adult medical examination without abnormal findings: Secondary | ICD-10-CM | POA: Diagnosis not present

## 2022-02-14 DIAGNOSIS — Z1329 Encounter for screening for other suspected endocrine disorder: Secondary | ICD-10-CM | POA: Insufficient documentation

## 2022-02-14 DIAGNOSIS — R7303 Prediabetes: Secondary | ICD-10-CM | POA: Diagnosis not present

## 2022-02-14 DIAGNOSIS — Z23 Encounter for immunization: Secondary | ICD-10-CM | POA: Diagnosis not present

## 2022-02-14 DIAGNOSIS — Z6838 Body mass index (BMI) 38.0-38.9, adult: Secondary | ICD-10-CM

## 2022-02-14 DIAGNOSIS — Z1231 Encounter for screening mammogram for malignant neoplasm of breast: Secondary | ICD-10-CM | POA: Insufficient documentation

## 2022-02-14 DIAGNOSIS — E66812 Obesity, class 2: Secondary | ICD-10-CM | POA: Insufficient documentation

## 2022-02-14 MED ORDER — BUPROPION HCL ER (XL) 150 MG PO TB24: 150.0000 mg | ORAL_TABLET | Freq: Every day | ORAL | 0 refills | Status: DC

## 2022-02-14 MED ORDER — NYSTATIN-TRIAMCINOLONE 100000-0.1 UNIT/GM-% EX OINT: 1.0000 | TOPICAL_OINTMENT | Freq: Two times a day (BID) | CUTANEOUS | 0 refills | Status: DC

## 2022-02-14 MED ORDER — PHENTERMINE HCL 37.5 MG PO CAPS: 37.5000 mg | ORAL_CAPSULE | ORAL | 0 refills | Status: DC

## 2022-02-14 MED ORDER — LOSARTAN POTASSIUM-HCTZ 50-12.5 MG PO TABS: 1.0000 | ORAL_TABLET | Freq: Every day | ORAL | 3 refills | Status: DC

## 2022-02-14 NOTE — Assessment & Plan Note (Signed)
Chronic, previously stable Repeat LP I continue to recommend diet low in saturated fat and regular exercise - 30 min at least 5 times per week

## 2022-02-14 NOTE — Assessment & Plan Note (Addendum)
Chronic, worsening  Body mass index is 38.92 kg/m. Wishes to start medication to start weight loss Discussed use of orals vs injectables Agreeable to orals RTC in 3 months

## 2022-02-14 NOTE — Assessment & Plan Note (Signed)
Chronic, stable Repeat A1c Continue to recommend balanced, lower carb meals. Smaller meal size, adding snacks. Choosing water as drink of choice and increasing purposeful exercise.  

## 2022-02-14 NOTE — Assessment & Plan Note (Signed)
Screening for thyroid given obesity and HTN

## 2022-02-14 NOTE — Assessment & Plan Note (Signed)
Completed; repeat in 10 years or PRN

## 2022-02-14 NOTE — Assessment & Plan Note (Signed)
Due for screening for mammogram, denies breast concerns, provided with phone number to call and schedule appointment for mammogram. Encouraged to repeat breast cancer screening every 1-2 years.  

## 2022-02-14 NOTE — Assessment & Plan Note (Signed)
UTD on vision and dental Things to do to keep yourself healthy  - Exercise at least 30-45 minutes a day, 3-4 days a week.  - Eat a low-fat diet with lots of fruits and vegetables, up to 7-9 servings per day.  - Seatbelts can save your life. Wear them always.  - Smoke detectors on every level of your home, check batteries every year.  - Eye Doctor - have an eye exam every 1-2 years  - Safe sex - if you may be exposed to STDs, use a condom.  - Alcohol -  If you drink, do it moderately, less than 2 drinks per day.  - Health Care Power of Attorney. Choose someone to speak for you if you are not able.  - Depression is common in our stressful world.If you're feeling down or losing interest in things you normally enjoy, please come in for a visit.  - Violence - If anyone is threatening or hurting you, please call immediately.  

## 2022-02-14 NOTE — Assessment & Plan Note (Signed)
New diagnosis, agreeable to start medication while working on diet and exercise Goal <140/<90

## 2022-02-14 NOTE — Assessment & Plan Note (Signed)
Acute, intermittent Request for topical medication to assist Denies concern for STI Denies pain with intercourse

## 2022-02-15 ENCOUNTER — Encounter: Payer: Self-pay | Admitting: Family Medicine

## 2022-02-15 LAB — CBC WITH DIFFERENTIAL/PLATELET
Basophils Absolute: 0.1 10*3/uL (ref 0.0–0.2)
Basos: 1 %
EOS (ABSOLUTE): 0.1 10*3/uL (ref 0.0–0.4)
Eos: 2 %
Hematocrit: 45.9 % (ref 34.0–46.6)
Hemoglobin: 15.6 g/dL (ref 11.1–15.9)
Immature Grans (Abs): 0 10*3/uL (ref 0.0–0.1)
Immature Granulocytes: 0 %
Lymphocytes Absolute: 2.1 10*3/uL (ref 0.7–3.1)
Lymphs: 31 %
MCH: 29.1 pg (ref 26.6–33.0)
MCHC: 34 g/dL (ref 31.5–35.7)
MCV: 86 fL (ref 79–97)
Monocytes Absolute: 0.4 10*3/uL (ref 0.1–0.9)
Monocytes: 6 %
Neutrophils Absolute: 4.2 10*3/uL (ref 1.4–7.0)
Neutrophils: 60 %
Platelets: 234 10*3/uL (ref 150–450)
RBC: 5.37 x10E6/uL — ABNORMAL HIGH (ref 3.77–5.28)
RDW: 12.9 % (ref 11.7–15.4)
WBC: 7 10*3/uL (ref 3.4–10.8)

## 2022-02-15 LAB — LIPID PANEL
Chol/HDL Ratio: 3.3 ratio (ref 0.0–4.4)
Cholesterol, Total: 199 mg/dL (ref 100–199)
HDL: 61 mg/dL (ref >39)
LDL Chol Calc (NIH): 123 mg/dL — ABNORMAL HIGH (ref 0–99)
Triglycerides: 86 mg/dL (ref 0–149)
VLDL Cholesterol Cal: 15 mg/dL (ref 5–40)

## 2022-02-15 LAB — COMPREHENSIVE METABOLIC PANEL
ALT: 31 IU/L (ref 0–32)
AST: 30 IU/L (ref 0–40)
Albumin/Globulin Ratio: 1.4 (ref 1.2–2.2)
Albumin: 4.2 g/dL (ref 3.8–4.9)
Alkaline Phosphatase: 101 IU/L (ref 44–121)
BUN/Creatinine Ratio: 18 (ref 9–23)
BUN: 13 mg/dL (ref 6–24)
Bilirubin Total: 0.4 mg/dL (ref 0.0–1.2)
CO2: 23 mmol/L (ref 20–29)
Calcium: 9.6 mg/dL (ref 8.7–10.2)
Chloride: 102 mmol/L (ref 96–106)
Creatinine, Ser: 0.72 mg/dL (ref 0.57–1.00)
Globulin, Total: 3.1 g/dL (ref 1.5–4.5)
Glucose: 103 mg/dL — ABNORMAL HIGH (ref 70–99)
Potassium: 4.9 mmol/L (ref 3.5–5.2)
Sodium: 141 mmol/L (ref 134–144)
Total Protein: 7.3 g/dL (ref 6.0–8.5)
eGFR: 97 mL/min/{1.73_m2} (ref >59)

## 2022-02-15 LAB — TSH+FREE T4
Free T4: 1.04 ng/dL (ref 0.82–1.77)
TSH: 2.52 u[IU]/mL (ref 0.450–4.500)

## 2022-02-15 LAB — HEMOGLOBIN A1C
Est. average glucose Bld gHb Est-mCnc: 126 mg/dL
Hgb A1c MFr Bld: 6 % — ABNORMAL HIGH (ref 4.8–5.6)

## 2022-02-15 NOTE — Progress Notes (Signed)
Labs are relatively normal and stable; pre-diabetes and elevated LDL/bad cholesterol remains. Stroke risk/heart attack risk remains stable at 5% in 10 years. I continue to recommend diet low in saturated fat and regular exercise - 30 min at least 5 times per week  The 10-year ASCVD risk score (Arnett DK, et al., 2019) is: 4.3%   Values used to calculate the score:     Age: 56 years     Sex: Female     Is Non-Hispanic African American: No     Diabetic: No     Tobacco smoker: No     Systolic Blood Pressure: 696 mmHg     Is BP treated: Yes     HDL Cholesterol: 61 mg/dL     Total Cholesterol: 199 mg/dL

## 2022-03-20 ENCOUNTER — Encounter: Payer: Self-pay | Admitting: Family Medicine

## 2022-04-15 ENCOUNTER — Encounter: Payer: Self-pay | Admitting: Dermatology

## 2022-04-17 ENCOUNTER — Other Ambulatory Visit: Payer: Self-pay

## 2022-04-17 DIAGNOSIS — L400 Psoriasis vulgaris: Secondary | ICD-10-CM

## 2022-04-17 MED ORDER — ZORYVE 0.3 % EX CREA: TOPICAL_CREAM | CUTANEOUS | 3 refills | Status: AC

## 2022-04-17 NOTE — Progress Notes (Signed)
See MyChart message

## 2022-05-11 ENCOUNTER — Other Ambulatory Visit: Payer: Self-pay | Admitting: Family Medicine

## 2022-05-11 DIAGNOSIS — N898 Other specified noninflammatory disorders of vagina: Secondary | ICD-10-CM

## 2022-05-12 ENCOUNTER — Other Ambulatory Visit: Payer: Self-pay | Admitting: Family Medicine

## 2022-05-14 ENCOUNTER — Other Ambulatory Visit: Payer: Self-pay | Admitting: Family Medicine

## 2022-05-15 ENCOUNTER — Encounter: Payer: Self-pay | Admitting: Family Medicine

## 2022-05-15 MED ORDER — PHENTERMINE HCL 37.5 MG PO CAPS: 37.5000 mg | ORAL_CAPSULE | ORAL | 0 refills | Status: DC

## 2022-05-15 NOTE — Telephone Encounter (Signed)
Please review. Last office visit 02/14/22.  KP

## 2022-05-16 ENCOUNTER — Ambulatory Visit: Payer: 59 | Admitting: Family Medicine

## 2022-05-29 NOTE — Progress Notes (Signed)
I,Seriyah R Striblin,acting as a Education administrator for Gwyneth Sprout, FNP.,have documented all relevant documentation on the behalf of Gwyneth Sprout, FNP,as directed by  Gwyneth Sprout, FNP while in the presence of Gwyneth Sprout, FNP.  Established patient visit  Patient: Madeline Bryant   DOB: 1965/10/03   57 y.o. Female  MRN: [id number] Visit Date: 05/30/2022  Today's healthcare provider: Gwyneth Sprout, FNP  Introduced to nurse practitioner role and practice setting.  All questions answered.  Discussed provider/patient relationship and expectations.  Subjective    HPI  Follow up for Weight Management   The patient was last seen for this 3 months ago. Changes made at last visit include Discussed use of orals vs injectables Agreeable to orals RTC in 3 months .  She reports excellent compliance with treatment. She feels that condition is Improved. She is not having side effects.   -----------------------------------------------------------------------------------------  Medications: Outpatient Medications Prior to Visit  Medication Sig   losartan-hydrochlorothiazide (HYZAAR) 50-12.5 MG tablet Take 1 tablet by mouth daily.   Multiple Vitamin (MULTIVITAMIN) tablet Take 1 tablet by mouth daily.   nystatin-triamcinolone ointment (MYCOLOG) APPLY TO AFFECTED AREA TWICE A DAY   Roflumilast (ZORYVE) 0.3 % CREA Apply to aa's psoriasis QD PRN.   [DISCONTINUED] buPROPion (WELLBUTRIN XL) 150 MG 24 hr tablet TAKE 1 TABLET BY MOUTH EVERY DAY   [DISCONTINUED] phentermine 37.5 MG capsule Take 1 capsule (37.5 mg total) by mouth every morning. Keep next appt   No facility-administered medications prior to visit.    Review of Systems    Objective    BP (!) 132/92 (BP Location: Left Arm, Patient Position: Sitting, Cuff Size: Normal)   Pulse 100   Temp 98.1 F (36.7 C) (Oral)   Resp 16   Wt 231 lb (104.8 kg)   SpO2 99%   BMI 35.12 kg/m   Physical Exam Vitals and nursing note reviewed.   Constitutional:      General: She is not in acute distress.    Appearance: Normal appearance. She is obese. She is not ill-appearing, toxic-appearing or diaphoretic.  HENT:     Head: Normocephalic and atraumatic.  Cardiovascular:     Rate and Rhythm: Regular rhythm. Tachycardia present.     Pulses: Normal pulses.     Heart sounds: Normal heart sounds. No murmur heard.    No friction rub. No gallop.     Comments: Borderline SR/ST Pulmonary:     Effort: Pulmonary effort is normal. No respiratory distress.     Breath sounds: Normal breath sounds. No stridor. No wheezing, rhonchi or rales.  Chest:     Chest wall: No tenderness.  Musculoskeletal:        General: No swelling, tenderness, deformity or signs of injury. Normal range of motion.     Right lower leg: No edema.     Left lower leg: No edema.  Skin:    General: Skin is warm and dry.     Capillary Refill: Capillary refill takes less than 2 seconds.     Coloration: Skin is not jaundiced or pale.     Findings: No bruising, erythema, lesion or rash.  Neurological:     General: No focal deficit present.     Mental Status: She is alert and oriented to person, place, and time. Mental status is at baseline.     Cranial Nerves: No cranial nerve deficit.     Sensory: No sensory deficit.     Motor: No  weakness.     Coordination: Coordination normal.  Psychiatric:        Mood and Affect: Mood normal.        Behavior: Behavior normal.        Thought Content: Thought content normal.        Judgment: Judgment normal.     No results found for any visits on 05/30/22.  Assessment & Plan     Problem List Items Addressed This Visit       Cardiovascular and Mediastinum   Primary hypertension    Chronic, borderline Pt notes being under a lot of stress at work this week with a new computer system as well as some dietary indiscretions with her husband's birthday and Valentine's day Goal of <140/<90 with pre-diabetes and HLD Currently  on Hyzaar 50-12.5; continue to recommend closer following of DASH based diet and will see back in 3 months. May need 100-25 of Hyzaar if elevated despite weight loss.        Other   Class 2 severe obesity due to excess calories with serious comorbidity and body mass index (BMI) of 38.0 to 38.9 in adult [facility])    Now Body mass index is 35.12 kg/m. Down 25# in 3 months with use of phen and wellbutrin; slight ST in office today and borderline DBP Discussed importance of healthy weight management Discussed diet and exercise Wishes to continue current POC with close monitoring       Relevant Medications   buPROPion (WELLBUTRIN XL) 150 MG 24 hr tablet   phentermine 37.5 MG capsule   Encounter for weight management - Primary    Chronic, obesity Improving Has lost 9.76% of BW in 3 months Wishes to continue phen and wellbutrin despite borderline tachycardia and DBP elevation. Continue with close following, require in office vs virtual       Prediabetes    Chronic, stable at labs in 02/2022 Working on weight mgmt and has been successful Continue to recommend balanced, lower carb meals. Smaller meal size, adding snacks. Choosing water as drink of choice and increasing purposeful exercise. Repeat A1c in 3 months      Return in about 3 months (around 08/28/2022) for weight mgmt.     Vonna Kotyk, FNP, have reviewed all documentation for this visit. The documentation on 05/30/22 for the exam, diagnosis, procedures, and orders are all accurate and complete.  Gwyneth Sprout, [place] [phone] (phone) [phone] (fax)  [place]

## 2022-05-30 ENCOUNTER — Encounter: Payer: Self-pay | Admitting: Family Medicine

## 2022-05-30 ENCOUNTER — Ambulatory Visit (INDEPENDENT_AMBULATORY_CARE_PROVIDER_SITE_OTHER): Payer: 59 | Admitting: Family Medicine

## 2022-05-30 VITALS — BP 132/92 | HR 100 | Temp 98.1°F | Resp 16 | Wt 231.0 lb

## 2022-05-30 DIAGNOSIS — R7303 Prediabetes: Secondary | ICD-10-CM

## 2022-05-30 DIAGNOSIS — Z7689 Persons encountering health services in other specified circumstances: Secondary | ICD-10-CM | POA: Diagnosis not present

## 2022-05-30 DIAGNOSIS — Z6838 Body mass index (BMI) 38.0-38.9, adult: Secondary | ICD-10-CM

## 2022-05-30 DIAGNOSIS — I10 Essential (primary) hypertension: Secondary | ICD-10-CM

## 2022-05-30 MED ORDER — PHENTERMINE HCL 37.5 MG PO CAPS: 37.5000 mg | ORAL_CAPSULE | ORAL | 0 refills | Status: DC

## 2022-05-30 MED ORDER — BUPROPION HCL ER (XL) 150 MG PO TB24: 150.0000 mg | ORAL_TABLET | Freq: Every day | ORAL | 0 refills | Status: DC

## 2022-05-30 NOTE — Assessment & Plan Note (Signed)
Chronic, borderline Pt notes being under a lot of stress at work this week with a new computer system as well as some dietary indiscretions with her husband's birthday and Valentine's day Goal of <140/<90 with pre-diabetes and HLD Currently on Hyzaar 50-12.5; continue to recommend closer following of DASH based diet and will see back in 3 months. May need 100-25 of Hyzaar if elevated despite weight loss.

## 2022-05-30 NOTE — Assessment & Plan Note (Signed)
Chronic, stable at labs in 02/2022 Working on weight mgmt and has been successful Continue to recommend balanced, lower carb meals. Smaller meal size, adding snacks. Choosing water as drink of choice and increasing purposeful exercise. Repeat A1c in 3 months

## 2022-05-30 NOTE — Assessment & Plan Note (Signed)
Chronic, obesity Improving Has lost 9.76% of BW in 3 months Wishes to continue phen and wellbutrin despite borderline tachycardia and DBP elevation. Continue with close following, require in office vs virtual

## 2022-05-30 NOTE — Assessment & Plan Note (Signed)
Now Body mass index is 35.12 kg/m. Down 25# in 3 months with use of phen and wellbutrin; slight ST in office today and borderline DBP Discussed importance of healthy weight management Discussed diet and exercise Wishes to continue current POC with close monitoring

## 2022-08-11 ENCOUNTER — Other Ambulatory Visit: Payer: Self-pay | Admitting: Family Medicine

## 2022-08-22 ENCOUNTER — Ambulatory Visit (INDEPENDENT_AMBULATORY_CARE_PROVIDER_SITE_OTHER): Payer: 59 | Admitting: Family Medicine

## 2022-08-22 ENCOUNTER — Encounter: Payer: Self-pay | Admitting: Family Medicine

## 2022-08-22 VITALS — BP 135/87 | HR 100 | Temp 98.0°F | Resp 12 | Ht 67.0 in | Wt 223.6 lb

## 2022-08-22 DIAGNOSIS — R7303 Prediabetes: Secondary | ICD-10-CM | POA: Diagnosis not present

## 2022-08-22 DIAGNOSIS — I10 Essential (primary) hypertension: Secondary | ICD-10-CM

## 2022-08-22 DIAGNOSIS — E782 Mixed hyperlipidemia: Secondary | ICD-10-CM

## 2022-08-22 DIAGNOSIS — Z7689 Persons encountering health services in other specified circumstances: Secondary | ICD-10-CM

## 2022-08-22 DIAGNOSIS — Z5320 Procedure and treatment not carried out because of patient's decision for unspecified reasons: Secondary | ICD-10-CM | POA: Insufficient documentation

## 2022-08-22 DIAGNOSIS — Z6838 Body mass index (BMI) 38.0-38.9, adult: Secondary | ICD-10-CM

## 2022-08-22 DIAGNOSIS — Z1231 Encounter for screening mammogram for malignant neoplasm of breast: Secondary | ICD-10-CM

## 2022-08-22 MED ORDER — PHENTERMINE HCL 37.5 MG PO CAPS: 37.5000 mg | ORAL_CAPSULE | ORAL | 0 refills | Status: DC

## 2022-08-22 MED ORDER — BUPROPION HCL ER (XL) 150 MG PO TB24: 150.0000 mg | ORAL_TABLET | Freq: Every day | ORAL | 0 refills | Status: DC

## 2022-08-22 MED ORDER — LOSARTAN POTASSIUM-HCTZ 50-12.5 MG PO TABS: 1.0000 | ORAL_TABLET | Freq: Every day | ORAL | 3 refills | Status: DC

## 2022-08-22 NOTE — Assessment & Plan Note (Signed)
Chronic, previously stable with diet/exercise Repeat A1c Not on Rx Continue to recommend balanced, lower carb meals. Smaller meal size, adding snacks. Choosing water as drink of choice and increasing purposeful exercise.

## 2022-08-22 NOTE — Assessment & Plan Note (Signed)
Chronic, remains stable/improved with continued weight loss Continue to closely monitor Goal <139/<89 Continue Hyzaar 50-12.5 Check CMP and CBC

## 2022-08-22 NOTE — Assessment & Plan Note (Signed)
Starting weight 256; weight today 223 ~13% weight loss Body mass index is 35.02 kg/m. Today Discussed importance of healthy weight management Discussed diet and exercise

## 2022-08-22 NOTE — Progress Notes (Signed)
I,Sulibeya S Dimas,acting as a Neurosurgeon for Jacky Kindle, FNP.,have documented all relevant documentation on the behalf of Jacky Kindle, FNP,as directed by  Jacky Kindle, FNP while in the presence of Jacky Kindle, FNP.     Established patient visit   Patient: Madeline Bryant   DOB: [birth year]   57 y.o. Female  MRN: [id number] Visit Date: 08/22/2022  Today's healthcare provider: Jacky Kindle, FNP  Re Introduced to nurse practitioner role and practice setting.  All questions answered.  Discussed provider/patient relationship and expectations.   Chief Complaint  Patient presents with   Obesity   Subjective    HPI  Follow up for weight  The patient was last seen for this 3 months ago. Changes made at last visit include phentermine.  She reports excellent compliance with treatment. She feels that condition is Improved. She is not having side effects.   Wt Readings from Last 3 Encounters:  08/22/22 223 lb 9.6 oz (101.4 kg)  05/30/22 231 lb (104.8 kg)  02/14/22 256 lb (116.1 kg)     -----------------------------------------------------------------------------------------   Medications: Outpatient Medications Prior to Visit  Medication Sig   Multiple Vitamin (MULTIVITAMIN) tablet Take 1 tablet by mouth daily.   nystatin-triamcinolone ointment (MYCOLOG) APPLY TO AFFECTED AREA TWICE A DAY   Roflumilast (ZORYVE) 0.3 % CREA Apply to aa's psoriasis QD PRN.   [DISCONTINUED] buPROPion (WELLBUTRIN XL) 150 MG 24 hr tablet Take 1 tablet (150 mg total) by mouth daily.   [DISCONTINUED] phentermine 37.5 MG capsule TAKE 1 CAPSULE (37.5 MG TOTAL) BY MOUTH EVERY MORNING. KEEP NEXT APPT   [DISCONTINUED] losartan-hydrochlorothiazide (HYZAAR) 50-12.5 MG tablet Take 1 tablet by mouth daily.   No facility-administered medications prior to visit.    Review of Systems    Objective    BP 135/87 (BP Location: Right Arm, Patient Position: Sitting, Cuff Size: Large)   Pulse 100   Temp  98 F (36.7 C) (Temporal)   Resp 12   Ht 5\' 7"  (1.702 m)   Wt 223 lb 9.6 oz (101.4 kg)   SpO2 97%   BMI 35.02 kg/m   Physical Exam Vitals and nursing note reviewed.  Constitutional:      General: She is not in acute distress.    Appearance: Normal appearance. She is obese. She is not ill-appearing, toxic-appearing or diaphoretic.  HENT:     Head: Normocephalic and atraumatic.  Cardiovascular:     Rate and Rhythm: Regular rhythm. Tachycardia present.     Pulses: Normal pulses.     Heart sounds: Normal heart sounds. No murmur heard.    No friction rub. No gallop.  Pulmonary:     Effort: Pulmonary effort is normal. No respiratory distress.     Breath sounds: Normal breath sounds. No stridor. No wheezing, rhonchi or rales.  Chest:     Chest wall: No tenderness.  Musculoskeletal:        General: No swelling, tenderness, deformity or signs of injury. Normal range of motion.     Right lower leg: No edema.     Left lower leg: No edema.  Skin:    General: Skin is warm and dry.     Capillary Refill: Capillary refill takes less than 2 seconds.     Coloration: Skin is not jaundiced or pale.     Findings: No bruising, erythema, lesion or rash.  Neurological:     General: No focal deficit present.     Mental Status: She is  alert and oriented to person, place, and time. Mental status is at baseline.     Cranial Nerves: No cranial nerve deficit.     Sensory: No sensory deficit.     Motor: No weakness.     Coordination: Coordination normal.  Psychiatric:        Mood and Affect: Mood normal.        Behavior: Behavior normal.        Thought Content: Thought content normal.        Judgment: Judgment normal.     No results found for any visits on 08/22/22.  Assessment & Plan     Problem List Items Addressed This Visit       Cardiovascular and Mediastinum   Primary hypertension    Chronic, remains stable/improved with continued weight loss Continue to closely monitor Goal  <139/<89 Continue Hyzaar 50-12.5 Check CMP and CBC      Relevant Medications   losartan-hydrochlorothiazide (HYZAAR) 50-12.5 MG tablet   Other Relevant Orders   Comprehensive Metabolic Panel (CMET)   CBC     Other   Class 2 severe obesity due to excess calories with serious comorbidity and body mass index (BMI) of 38.0 to 38.9 in adult (HCC)    Starting weight 256; weight today 223 ~13% weight loss Body mass index is 35.02 kg/m. Today Discussed importance of healthy weight management Discussed diet and exercise       Relevant Medications   buPROPion (WELLBUTRIN XL) 150 MG 24 hr tablet   phentermine 37.5 MG capsule   Encounter for weight management - Primary    Lost an additional 8# in 3 months Wishes to continue phentermine to assist with energy levels and weight mgmt BP remains stable/improved Continue 3 month appts Congratulated on exercise efforts      Mammogram declined    Clip placed 12/22 with bx 1/23; pt reports multiple thousands of dollars out of pocket; has been monitoring breast site for changes; aware of recommendation for mammogram. Wishes to defer at this time with close self screening.      Moderate mixed hyperlipidemia not requiring statin therapy    Chronic, repeat LP with weight loss recommend diet low in saturated fat and regular exercise - 30 min at least 5 times per week The 10-year ASCVD risk score (Arnett DK, et al., 2019) is: 3.7%       Relevant Medications   losartan-hydrochlorothiazide (HYZAAR) 50-12.5 MG tablet   Other Relevant Orders   Lipid panel   Prediabetes    Chronic, previously stable with diet/exercise Repeat A1c Not on Rx Continue to recommend balanced, lower carb meals. Smaller meal size, adding snacks. Choosing water as drink of choice and increasing purposeful exercise.       Relevant Orders   Hemoglobin A1c   RESOLVED: Screening mammogram for breast cancer   Return in about 3 months (around 11/22/2022) for chonic  disease management.     Leilani Merl, FNP, have reviewed all documentation for this visit. The documentation on 08/22/22 for the exam, diagnosis, procedures, and orders are all accurate and complete.  Jacky Kindle, FNP  [facility] [phone] (phone) [phone] (fax)  [facility]

## 2022-08-22 NOTE — Assessment & Plan Note (Signed)
Chronic, repeat LP with weight loss recommend diet low in saturated fat and regular exercise - 30 min at least 5 times per week The 10-year ASCVD risk score (Arnett DK, et al., 2019) is: 3.7%

## 2022-08-22 NOTE — Assessment & Plan Note (Signed)
Lost an additional 8# in 3 months Wishes to continue phentermine to assist with energy levels and weight mgmt BP remains stable/improved Continue 3 month appts Congratulated on exercise efforts

## 2022-08-22 NOTE — Progress Notes (Deleted)
      Established patient visit   Patient: Madeline Bryant   DOB: 06/28/1965   57 y.o. Female  MRN: [id number] Visit Date: 08/22/2022  Today's healthcare provider: Jacky Kindle, FNP   No chief complaint on file.  Subjective    HPI  ***  Medications: Outpatient Medications Prior to Visit  Medication Sig   buPROPion (WELLBUTRIN XL) 150 MG 24 hr tablet Take 1 tablet (150 mg total) by mouth daily.   Multiple Vitamin (MULTIVITAMIN) tablet Take 1 tablet by mouth daily.   nystatin-triamcinolone ointment (MYCOLOG) APPLY TO AFFECTED AREA TWICE A DAY   phentermine 37.5 MG capsule TAKE 1 CAPSULE (37.5 MG TOTAL) BY MOUTH EVERY MORNING. KEEP NEXT APPT   Roflumilast (ZORYVE) 0.3 % CREA Apply to aa's psoriasis QD PRN.   [DISCONTINUED] losartan-hydrochlorothiazide (HYZAAR) 50-12.5 MG tablet Take 1 tablet by mouth daily.   No facility-administered medications prior to visit.    Review of Systems  {Labs  Heme  Chem  Endocrine  Serology  Results Review (optional):23779}   Objective    There were no vitals taken for this visit. {Show previous vital signs (optional):23777}  Physical Exam  ***  No results found for any visits on 08/22/22.  Assessment & Plan     ***  No follow-ups on file.      {provider attestation***:1}   Jacky Kindle, FNP  [facility] [phone] (phone) [phone] (fax)  [facility]

## 2022-08-22 NOTE — Assessment & Plan Note (Signed)
Clip placed 12/22 with bx 1/23; pt reports multiple thousands of dollars out of pocket; has been monitoring breast site for changes; aware of recommendation for mammogram. Wishes to defer at this time with close self screening.

## 2022-08-23 LAB — COMPREHENSIVE METABOLIC PANEL
ALT: 16 IU/L (ref 0–32)
AST: 18 IU/L (ref 0–40)
Albumin/Globulin Ratio: 1.5 (ref 1.2–2.2)
Albumin: 4.3 g/dL (ref 3.8–4.9)
Alkaline Phosphatase: 106 IU/L (ref 44–121)
BUN/Creatinine Ratio: 15 (ref 9–23)
BUN: 13 mg/dL (ref 6–24)
Bilirubin Total: 0.4 mg/dL (ref 0.0–1.2)
CO2: 24 mmol/L (ref 20–29)
Calcium: 9.4 mg/dL (ref 8.7–10.2)
Chloride: 100 mmol/L (ref 96–106)
Creatinine, Ser: 0.89 mg/dL (ref 0.57–1.00)
Globulin, Total: 2.8 g/dL (ref 1.5–4.5)
Glucose: 89 mg/dL (ref 70–99)
Potassium: 4 mmol/L (ref 3.5–5.2)
Sodium: 141 mmol/L (ref 134–144)
Total Protein: 7.1 g/dL (ref 6.0–8.5)
eGFR: 75 mL/min/{1.73_m2} (ref >59)

## 2022-08-23 LAB — CBC
Hematocrit: 44.3 % (ref 34.0–46.6)
Hemoglobin: 15 g/dL (ref 11.1–15.9)
MCH: 29.4 pg (ref 26.6–33.0)
MCHC: 33.9 g/dL (ref 31.5–35.7)
MCV: 87 fL (ref 79–97)
Platelets: 258 10*3/uL (ref 150–450)
RBC: 5.11 x10E6/uL (ref 3.77–5.28)
RDW: 12.7 % (ref 11.7–15.4)
WBC: 7.3 10*3/uL (ref 3.4–10.8)

## 2022-08-23 LAB — LIPID PANEL
Chol/HDL Ratio: 3.3 ratio (ref 0.0–4.4)
Cholesterol, Total: 196 mg/dL (ref 100–199)
HDL: 60 mg/dL (ref >39)
LDL Chol Calc (NIH): 118 mg/dL — ABNORMAL HIGH (ref 0–99)
Triglycerides: 98 mg/dL (ref 0–149)
VLDL Cholesterol Cal: 18 mg/dL (ref 5–40)

## 2022-08-23 LAB — HEMOGLOBIN A1C
Est. average glucose Bld gHb Est-mCnc: 123 mg/dL
Hgb A1c MFr Bld: 5.9 % — ABNORMAL HIGH (ref 4.8–5.6)

## 2022-08-25 NOTE — Progress Notes (Signed)
Slight improvement in cholesterol including LDL. The 10-year ASCVD risk score (Arnett DK, et al., 2019) is: 3.7% Slight improvement in A1c; remains in pre-diabetic range. CBC and CMP normal and stable.

## 2022-10-27 ENCOUNTER — Other Ambulatory Visit: Payer: Self-pay | Admitting: Family Medicine

## 2022-10-30 ENCOUNTER — Other Ambulatory Visit: Payer: Self-pay | Admitting: Family Medicine

## 2022-10-30 ENCOUNTER — Encounter: Payer: Self-pay | Admitting: Family Medicine

## 2022-10-30 MED ORDER — PHENTERMINE HCL 37.5 MG PO CAPS: 37.5000 mg | ORAL_CAPSULE | ORAL | 0 refills | Status: DC

## 2022-11-28 ENCOUNTER — Ambulatory Visit: Payer: BC Managed Care – PPO | Admitting: Family Medicine

## 2022-11-28 ENCOUNTER — Encounter: Payer: Self-pay | Admitting: Family Medicine

## 2022-11-28 VITALS — BP 122/84 | HR 96 | Ht 68.0 in | Wt 217.0 lb

## 2022-11-28 DIAGNOSIS — I10 Essential (primary) hypertension: Secondary | ICD-10-CM | POA: Diagnosis not present

## 2022-11-28 DIAGNOSIS — R7303 Prediabetes: Secondary | ICD-10-CM

## 2022-11-28 DIAGNOSIS — E669 Obesity, unspecified: Secondary | ICD-10-CM | POA: Diagnosis not present

## 2022-11-28 DIAGNOSIS — E66811 Obesity, class 1: Secondary | ICD-10-CM | POA: Insufficient documentation

## 2022-11-28 DIAGNOSIS — Z7689 Persons encountering health services in other specified circumstances: Secondary | ICD-10-CM | POA: Diagnosis not present

## 2022-11-28 MED ORDER — BUPROPION HCL ER (XL) 150 MG PO TB24: 150.0000 mg | ORAL_TABLET | Freq: Every day | ORAL | 3 refills | Status: DC

## 2022-11-28 MED ORDER — PHENTERMINE HCL 37.5 MG PO CAPS: 37.5000 mg | ORAL_CAPSULE | ORAL | 0 refills | Status: DC

## 2022-11-28 NOTE — Assessment & Plan Note (Signed)
HW 256 CW 217 Body mass index is 32.99 kg/m.  Patient wishes to continue phentermine daily to assist wellbutrin in addition to diet and exercise to continue weight loss Denies complications including but not limited to headache, palpitations, anxiety, GI upset, seizures.

## 2022-11-28 NOTE — Progress Notes (Signed)
Established patient visit   Patient: Madeline Bryant   DOB: Jul 29, 1965   57 y.o. Female  MRN: [id number] Visit Date: 11/28/2022  Today's healthcare provider: Jacky Kindle, FNP  Introduced to nurse practitioner role and practice setting.  All questions answered.  Discussed provider/patient relationship and expectations.  Subjective    HPI HPI     Medical Management of Chronic Issues    Additional comments: 3 month follow up on weight management, no concerns today       Last edited by Rolly Salter, CMA on 11/28/2022  8:20 AM.      Medications: Outpatient Medications Prior to Visit  Medication Sig   losartan-hydrochlorothiazide (HYZAAR) 50-12.5 MG tablet Take 1 tablet by mouth daily.   Multiple Vitamin (MULTIVITAMIN) tablet Take 1 tablet by mouth daily.   Roflumilast (ZORYVE) 0.3 % CREA Apply to aa's psoriasis QD PRN.   [DISCONTINUED] buPROPion (WELLBUTRIN XL) 150 MG 24 hr tablet Take 1 tablet (150 mg total) by mouth daily.   [DISCONTINUED] nystatin-triamcinolone ointment (MYCOLOG) APPLY TO AFFECTED AREA TWICE A DAY (Patient taking differently: as needed.)   [DISCONTINUED] phentermine 37.5 MG capsule Take 1 capsule (37.5 mg total) by mouth every morning. Keep next appt   No facility-administered medications prior to visit.    Review of Systems    Objective    BP 122/84 (BP Location: Left Arm, Patient Position: Sitting, Cuff Size: Large)   Pulse 96   Ht 5\' 8"  (1.727 m)   Wt 217 lb (98.4 kg)   SpO2 100%   BMI 32.99 kg/m   Physical Exam Vitals and nursing note reviewed.  Constitutional:      General: She is not in acute distress.    Appearance: Normal appearance. She is obese. She is not ill-appearing, toxic-appearing or diaphoretic.  HENT:     Head: Normocephalic and atraumatic.  Cardiovascular:     Rate and Rhythm: Normal rate and regular rhythm.     Pulses: Normal pulses.     Heart sounds: Normal heart sounds. No murmur heard.    No friction rub. No  gallop.  Pulmonary:     Effort: Pulmonary effort is normal. No respiratory distress.     Breath sounds: Normal breath sounds. No stridor. No wheezing, rhonchi or rales.  Chest:     Chest wall: No tenderness.  Musculoskeletal:        General: No swelling, tenderness, deformity or signs of injury. Normal range of motion.     Right lower leg: No edema.     Left lower leg: No edema.  Skin:    General: Skin is warm and dry.     Capillary Refill: Capillary refill takes less than 2 seconds.     Coloration: Skin is not jaundiced or pale.     Findings: No bruising, erythema, lesion or rash.  Neurological:     General: No focal deficit present.     Mental Status: She is alert and oriented to person, place, and time. Mental status is at baseline.     Cranial Nerves: No cranial nerve deficit.     Sensory: No sensory deficit.     Motor: No weakness.     Coordination: Coordination normal.  Psychiatric:        Mood and Affect: Mood normal.        Behavior: Behavior normal.        Thought Content: Thought content normal.        Judgment: Judgment normal.  No results found for any visits on 11/28/22.  Assessment & Plan     Problem List Items Addressed This Visit       Cardiovascular and Mediastinum   Primary hypertension    Chronic, stable Continue hyzaar 50-12.5 Continue diet/exercise Goal 139/89 without T2DM        Other   Encounter for weight management - Primary    HW 256 CW 217 Body mass index is 32.99 kg/m.  Patient wishes to continue phentermine daily to assist wellbutrin in addition to diet and exercise to continue weight loss Denies complications including but not limited to headache, palpitations, anxiety, GI upset, seizures.       Relevant Medications   buPROPion (WELLBUTRIN XL) 150 MG 24 hr tablet   phentermine 37.5 MG capsule   Obesity (BMI 30.0-34.9)    Chronic, improved Discussed importance of healthy weight management Discussed diet and exercise        Relevant Medications   buPROPion (WELLBUTRIN XL) 150 MG 24 hr tablet   phentermine 37.5 MG capsule   Prediabetes   Relevant Medications   buPROPion (WELLBUTRIN XL) 150 MG 24 hr tablet   phentermine 37.5 MG capsule   Return in about 3 months (around 02/28/2023) for chonic disease management.     Leilani Merl, FNP, have reviewed all documentation for this visit. The documentation on 11/28/22 for the exam, diagnosis, procedures, and orders are all accurate and complete.  Jacky Kindle, FNP  [facility] [phone] (phone) [phone] (fax)  [facility]

## 2022-11-28 NOTE — Assessment & Plan Note (Signed)
Chronic, stable Continue hyzaar 50-12.5 Continue diet/exercise Goal 139/89 without T2DM

## 2022-11-28 NOTE — Assessment & Plan Note (Signed)
Chronic, improved Discussed importance of healthy weight management Discussed diet and exercise

## 2022-12-30 ENCOUNTER — Telehealth: Payer: Self-pay

## 2022-12-30 NOTE — Telephone Encounter (Signed)
Copied from CRM [id number]. Topic: General - Other >> Dec 30, 2022  2:07 PM Santiya F wrote: Reason for CRM: [employer] [place] is calling in because pt's appointment on 11/28/22 was denied due to services not being covered. [employer] says the provider can review the claim and change the coding if it's been coded incorrectly and resubmit the claim. [employer] says their provider number is [phone]

## 2023-02-07 ENCOUNTER — Other Ambulatory Visit: Payer: Self-pay | Admitting: Family Medicine

## 2023-02-07 DIAGNOSIS — E66811 Obesity, class 1: Secondary | ICD-10-CM

## 2023-02-07 DIAGNOSIS — R7303 Prediabetes: Secondary | ICD-10-CM

## 2023-02-09 ENCOUNTER — Encounter: Payer: Self-pay | Admitting: Family Medicine

## 2023-02-09 MED ORDER — PHENTERMINE HCL 37.5 MG PO CAPS: 37.5000 mg | ORAL_CAPSULE | ORAL | 0 refills | Status: DC

## 2023-02-10 ENCOUNTER — Encounter: Payer: Self-pay | Admitting: Family Medicine

## 2023-02-10 ENCOUNTER — Other Ambulatory Visit: Payer: Self-pay

## 2023-02-10 DIAGNOSIS — R7303 Prediabetes: Secondary | ICD-10-CM

## 2023-02-10 DIAGNOSIS — E66811 Obesity, class 1: Secondary | ICD-10-CM

## 2023-02-27 ENCOUNTER — Ambulatory Visit: Payer: BC Managed Care – PPO | Admitting: Family Medicine

## 2023-03-20 ENCOUNTER — Ambulatory Visit: Payer: BC Managed Care – PPO | Admitting: Family Medicine

## 2023-03-20 ENCOUNTER — Encounter: Payer: Self-pay | Admitting: Family Medicine

## 2023-03-20 VITALS — BP 124/78 | HR 92 | Resp 16 | Ht 68.0 in | Wt 211.0 lb

## 2023-03-20 DIAGNOSIS — E66811 Obesity, class 1: Secondary | ICD-10-CM | POA: Diagnosis not present

## 2023-03-20 DIAGNOSIS — E6609 Other obesity due to excess calories: Secondary | ICD-10-CM | POA: Diagnosis not present

## 2023-03-20 DIAGNOSIS — Z6832 Body mass index (BMI) 32.0-32.9, adult: Secondary | ICD-10-CM

## 2023-03-20 DIAGNOSIS — E66812 Obesity, class 2: Secondary | ICD-10-CM | POA: Insufficient documentation

## 2023-03-20 DIAGNOSIS — I10 Essential (primary) hypertension: Secondary | ICD-10-CM

## 2023-03-20 MED ORDER — NALTREXONE HCL 50 MG PO TABS: 50.0000 mg | ORAL_TABLET | Freq: Every day | ORAL | 1 refills | Status: DC

## 2023-03-20 MED ORDER — BUPROPION HCL ER (XL) 300 MG PO TB24: 300.0000 mg | ORAL_TABLET | Freq: Every day | ORAL | 1 refills | Status: DC

## 2023-03-20 NOTE — Progress Notes (Signed)
Established patient visit   Patient: Madeline Bryant   DOB: 09-06-1965   57 y.o. Female  MRN: [id number] Visit Date: 03/20/2023  Today's healthcare provider: Jacky Kindle, FNP  Re Introduced to nurse practitioner role and practice setting.  All questions answered.  Discussed provider/patient relationship and expectations.  Chief Complaint  Patient presents with   Medical Management of Chronic Issues    Weight management   Subjective    HPI HPI     Medical Management of Chronic Issues    Additional comments: Weight management      Last edited by Marjie Skiff, CMA on 03/20/2023  8:03 AM.     The patient, with a history of hypertension and obesity, reports an overall improvement in their health status. Over the past year, their blood pressure has significantly improved, with readings going from 146/98 to 124/78. They have also lost weight, going from 256 pounds to 211 pounds. The patient attributes these improvements to their current medication regimen and lifestyle changes. However, they report that they no longer see a significant difference with the use of Phentermine for weight loss, as they did initially. The patient also mentions that they exercise regularly and have a generally active lifestyle.  Medications: Outpatient Medications Prior to Visit  Medication Sig   losartan-hydrochlorothiazide (HYZAAR) 50-12.5 MG tablet Take 1 tablet by mouth daily.   Multiple Vitamin (MULTIVITAMIN) tablet Take 1 tablet by mouth daily.   phentermine 37.5 MG capsule Take 1 capsule (37.5 mg total) by mouth every morning.   Roflumilast (ZORYVE) 0.3 % CREA Apply to aa's psoriasis QD PRN.   [DISCONTINUED] buPROPion (WELLBUTRIN XL) 150 MG 24 hr tablet Take 1 tablet (150 mg total) by mouth daily.   No facility-administered medications prior to visit.   Last CBC Lab Results  Component Value Date   WBC 7.3 08/22/2022   HGB 15.0 08/22/2022   HCT 44.3 08/22/2022   MCV 87 08/22/2022    MCH 29.4 08/22/2022   RDW 12.7 08/22/2022   PLT 258 08/22/2022   Last metabolic panel Lab Results  Component Value Date   GLUCOSE 89 08/22/2022   NA 141 08/22/2022   K 4.0 08/22/2022   CL 100 08/22/2022   CO2 24 08/22/2022   BUN 13 08/22/2022   CREATININE 0.89 08/22/2022   EGFR 75 08/22/2022   CALCIUM 9.4 08/22/2022   PROT 7.1 08/22/2022   ALBUMIN 4.3 08/22/2022   LABGLOB 2.8 08/22/2022   AGRATIO 1.5 08/22/2022   BILITOT 0.4 08/22/2022   ALKPHOS 106 08/22/2022   AST 18 08/22/2022   ALT 16 08/22/2022   Last lipids Lab Results  Component Value Date   CHOL 196 08/22/2022   HDL 60 08/22/2022   LDLCALC 118 (H) 08/22/2022   TRIG 98 08/22/2022   CHOLHDL 3.3 08/22/2022   Last hemoglobin A1c Lab Results  Component Value Date   HGBA1C 5.9 (H) 08/22/2022   Last thyroid functions Lab Results  Component Value Date   TSH 2.520 02/14/2022      Objective    BP 124/78 (BP Location: Left Arm, Patient Position: Sitting, Cuff Size: Normal)   Pulse 92   Resp 16   Ht 5\' 8"  (1.727 m)   Wt 211 lb (95.7 kg)   BMI 32.08 kg/m   Physical Exam Vitals and nursing note reviewed.  Constitutional:      General: She is not in acute distress.    Appearance: Normal appearance. She is obese. She  is not ill-appearing, toxic-appearing or diaphoretic.  HENT:     Head: Normocephalic and atraumatic.  Cardiovascular:     Rate and Rhythm: Normal rate and regular rhythm.     Pulses: Normal pulses.     Heart sounds: Normal heart sounds. No murmur heard.    No friction rub. No gallop.  Pulmonary:     Effort: Pulmonary effort is normal. No respiratory distress.     Breath sounds: Normal breath sounds. No stridor. No wheezing, rhonchi or rales.  Chest:     Chest wall: No tenderness.  Musculoskeletal:        General: No swelling, tenderness, deformity or signs of injury. Normal range of motion.     Right lower leg: No edema.     Left lower leg: No edema.  Skin:    General: Skin is warm  and dry.     Capillary Refill: Capillary refill takes less than 2 seconds.     Coloration: Skin is not jaundiced or pale.     Findings: No bruising, erythema, lesion or rash.  Neurological:     General: No focal deficit present.     Mental Status: She is alert and oriented to person, place, and time. Mental status is at baseline.     Cranial Nerves: No cranial nerve deficit.     Sensory: No sensory deficit.     Motor: No weakness.     Coordination: Coordination normal.  Psychiatric:        Mood and Affect: Mood normal.        Behavior: Behavior normal.        Thought Content: Thought content normal.        Judgment: Judgment normal.     No results found for any visits on 03/20/23.  Assessment & Plan     Problem List Items Addressed This Visit       Cardiovascular and Mediastinum   Primary hypertension    Chronic, improved Continue hyzaar 50-12.5 mg at this time Patient denies concerns for feelings of low blood pressure or feelings of dizziness        Other   Class 1 obesity due to excess calories with serious comorbidity and body mass index (BMI) of 32.0 to 32.9 in adult - Primary    Chronic, improved; however, low lb loss this past 3 months Advised to wean off phentermine with current supply while increasing wellbutrin to 300 mg with additional of naltrexone Continue to recommend balanced, lower carb meals. Smaller meal size, adding snacks. Choosing water as drink of choice and increasing purposeful exercise.       Return in about 6 weeks (around 05/01/2023) for annual examination.     Leilani Merl, FNP, have reviewed all documentation for this visit. The documentation on 03/20/23 for the exam, diagnosis, procedures, and orders are all accurate and complete.  Jacky Kindle, FNP  [facility] [phone] (phone) [phone] (fax)  [facility]

## 2023-03-20 NOTE — Assessment & Plan Note (Signed)
Chronic, improved Continue hyzaar 50-12.5 mg at this time Patient denies concerns for feelings of low blood pressure or feelings of dizziness

## 2023-03-20 NOTE — Assessment & Plan Note (Signed)
Chronic, improved; however, low lb loss this past 3 months Advised to wean off phentermine with current supply while increasing wellbutrin to 300 mg with additional of naltrexone Continue to recommend balanced, lower carb meals. Smaller meal size, adding snacks. Choosing water as drink of choice and increasing purposeful exercise.

## 2023-05-07 DIAGNOSIS — H5203 Hypermetropia, bilateral: Secondary | ICD-10-CM | POA: Diagnosis not present

## 2023-05-07 DIAGNOSIS — H2513 Age-related nuclear cataract, bilateral: Secondary | ICD-10-CM | POA: Diagnosis not present

## 2023-05-07 DIAGNOSIS — H52223 Regular astigmatism, bilateral: Secondary | ICD-10-CM | POA: Diagnosis not present

## 2023-05-07 DIAGNOSIS — H04123 Dry eye syndrome of bilateral lacrimal glands: Secondary | ICD-10-CM | POA: Diagnosis not present

## 2023-05-08 ENCOUNTER — Encounter: Payer: Self-pay | Admitting: Family Medicine

## 2023-05-08 ENCOUNTER — Ambulatory Visit (INDEPENDENT_AMBULATORY_CARE_PROVIDER_SITE_OTHER): Payer: BC Managed Care – PPO | Admitting: Family Medicine

## 2023-05-08 ENCOUNTER — Other Ambulatory Visit (HOSPITAL_COMMUNITY)
Admission: RE | Admit: 2023-05-08 | Discharge: 2023-05-08 | Disposition: A | Discharge status: Home / Self Care | Payer: BC Managed Care – PPO | Source: Ambulatory Visit | Attending: Family Medicine | Admitting: Family Medicine

## 2023-05-08 ENCOUNTER — Encounter: Payer: BC Managed Care – PPO | Admitting: Family Medicine

## 2023-05-08 VITALS — BP 132/87 | HR 92 | Ht 68.0 in | Wt 208.0 lb

## 2023-05-08 DIAGNOSIS — I10 Essential (primary) hypertension: Secondary | ICD-10-CM

## 2023-05-08 DIAGNOSIS — E66811 Obesity, class 1: Secondary | ICD-10-CM

## 2023-05-08 DIAGNOSIS — Z0000 Encounter for general adult medical examination without abnormal findings: Secondary | ICD-10-CM | POA: Diagnosis not present

## 2023-05-08 DIAGNOSIS — E782 Mixed hyperlipidemia: Secondary | ICD-10-CM

## 2023-05-08 DIAGNOSIS — Z124 Encounter for screening for malignant neoplasm of cervix: Secondary | ICD-10-CM | POA: Diagnosis not present

## 2023-05-08 DIAGNOSIS — Z0001 Encounter for general adult medical examination with abnormal findings: Secondary | ICD-10-CM | POA: Diagnosis not present

## 2023-05-08 DIAGNOSIS — R7303 Prediabetes: Secondary | ICD-10-CM | POA: Diagnosis not present

## 2023-05-08 DIAGNOSIS — Z1231 Encounter for screening mammogram for malignant neoplasm of breast: Secondary | ICD-10-CM | POA: Insufficient documentation

## 2023-05-08 MED ORDER — BUPROPION HCL 100 MG PO TABS: 100.0000 mg | ORAL_TABLET | Freq: Every day | ORAL | 0 refills | Status: DC

## 2023-05-08 NOTE — Assessment & Plan Note (Signed)
Weight Management Patient has been on phentermine and Wellbutrin/naltrexone for weight loss.  She reports not seeing significant benefits and wishes to discontinue these medications. -Taper off Wellbutrin over a 6-week period, starting with 150mg  for 2 weeks, then 100mg  for 2 weeks, and finally 50mg  for 2 weeks. -Discontinue phentermine and naltrexone. BMI today 31.63, 208lbs - pt happy with this change Continue daily exercise and small frequent meal for weight loss

## 2023-05-08 NOTE — Progress Notes (Signed)
Complete physical exam  Patient: Madeline Bryant   DOB: 15-Jan-1966   58 y.o. Female  MRN: [id number]  Subjective:    Chief Complaint  Patient presents with   Follow-up    6 week follow up    Annual Exam    Madeline Bryant is a 58 y.o. female who presents today for a complete physical exam. She reports consuming a low sodium diet. Home exercise routine includes calisthenics and stretching. She generally feels well. She reports sleeping well. She does have additional problems to discuss today.   Stopping weight loss meds.  The patient, a 58 year old individual with a past medical history of obesity, prediabetes, and hypertension, presents for a routine physical. She reports generally feeling well and has been adhering to a low-sodium diet, incorporating oatmeal, Greek yogurt, and vegetables into her meals. She also mentions cooking most meals at home. The patient engages in daily morning exercise using a fitness application on her phone, which includes strength training and full-body workouts. However, she does not engage in any additional cardio activities.  The patient has been on a weight loss medication regimen, including phentermine and a combination of Wellbutrin and naltrexone. She reports that she initially noticed a difference with phentermine, but the effects seem to have plateaued. She also expresses a desire to discontinue all weight loss medications, as she does not perceive any significant benefits from them. She has been on phentermine for a year and recently increased her Wellbutrin dosage to 300mg  in December. She plans to taper off Wellbutrin over the next six weeks.  In terms of preventive care, the patient is up-to-date with Cologuard testing and is agreeable to scheduling a mammogram. She has a dental check-up every six months and recently had an eye examination, during which she received a new prescription for her glasses. She works in an ophthalmology office, Adult nurse matters.  She has no known allergies and reports no recent falls. She has no family, clinical, or surgical changes in her history.  Most recent fall risk assessment:    03/20/2023    8:06 AM  Fall Risk   Falls in the past year? 0  Number falls in past yr: 0  Injury with Fall? 0  Risk for fall due to : No Fall Risks     Most recent depression screenings:    03/20/2023    8:06 AM 05/30/2022    8:35 AM  PHQ 2/9 Scores  PHQ - 2 Score 0 0  PHQ- 9 Score  0       03/20/2023    8:06 AM  GAD 7 : Generalized Anxiety Score  Nervous, Anxious, on Edge 0  Control/stop worrying 0  Worry too much - different things 0  Trouble relaxing 0  Restless 0  Easily annoyed or irritable 0  Afraid - awful might happen 0  Total GAD 7 Score 0  Anxiety Difficulty Not difficult at all      Vision:Within last year  Patient Active Problem List   Diagnosis Date Noted   Encounter for screening mammogram for malignant neoplasm of breast 05/08/2023   Pap smear for cervical cancer screening 05/08/2023   Class 1 obesity due to excess calories with serious comorbidity and body mass index (BMI) of 32.0 to 32.9 in adult 03/20/2023   Obesity (BMI 30.0-34.9) 11/28/2022   Annual physical exam 02/14/2022   Primary hypertension 02/14/2022   Moderate mixed hyperlipidemia not requiring statin therapy 02/08/2021   Prediabetes 02/08/2021  History reviewed. No pertinent past medical history. Past Surgical History:  Procedure Laterality Date   BREAST BIOPSY Left 04/17/2021   Affirm bx-calcs-"RIBBON" clip-path pending   NO PAST SURGERIES     Social History   Tobacco Use   Smoking status: Never   Smokeless tobacco: Never  Substance Use Topics   Alcohol use: Never   Drug use: Never   Family History  Problem Relation Age of Onset   Hypertension Mother    Hypertension Father    Prostate cancer Father    COPD Father    Kidney failure Father    Congestive Heart Failure Father    Esophageal  cancer Brother    Breast cancer Neg Hx    No Known Allergies    Patient Care Team: Sallee Provencal, FNP as PCP - General (Family Medicine)   Outpatient Medications Prior to Visit  Medication Sig   losartan-hydrochlorothiazide (HYZAAR) 50-12.5 MG tablet Take 1 tablet by mouth daily.   Multiple Vitamin (MULTIVITAMIN) tablet Take 1 tablet by mouth daily.   Roflumilast (ZORYVE) 0.3 % CREA Apply to aa's psoriasis QD PRN.   [DISCONTINUED] buPROPion (WELLBUTRIN XL) 300 MG 24 hr tablet Take 1 tablet (300 mg total) by mouth daily.   [DISCONTINUED] naltrexone (DEPADE) 50 MG tablet Take 1 tablet (50 mg total) by mouth daily.   [DISCONTINUED] phentermine 37.5 MG capsule Take 1 capsule (37.5 mg total) by mouth every morning. (Patient not taking: Reported on 05/08/2023)   No facility-administered medications prior to visit.    Review of Systems  All other systems reviewed and are negative.     Objective:     BP 132/87   Pulse 92   Ht 5\' 8"  (1.727 m)   Wt 208 lb (94.3 kg)   BMI 31.63 kg/m  BP Readings from Last 3 Encounters:  05/08/23 132/87  03/20/23 124/78  11/28/22 122/84   Wt Readings from Last 3 Encounters:  05/08/23 208 lb (94.3 kg)  03/20/23 211 lb (95.7 kg)  11/28/22 217 lb (98.4 kg)   Physical Exam Exam conducted with a chaperone present.  Constitutional:      General: She is not in acute distress.    Appearance: Normal appearance. She is normal weight. She is not ill-appearing.  HENT:     Head: Normocephalic.     Right Ear: Tympanic membrane normal.     Left Ear: Tympanic membrane normal.     Nose: Nose normal.     Mouth/Throat:     Mouth: Mucous membranes are moist.     Pharynx: Oropharynx is clear. No oropharyngeal exudate or posterior oropharyngeal erythema.  Eyes:     General: Lids are normal.     Extraocular Movements: Extraocular movements intact.     Right eye: Normal extraocular motion.     Left eye: Normal extraocular motion.     Conjunctiva/sclera:  Conjunctivae normal.     Right eye: Right conjunctiva is not injected.     Left eye: Left conjunctiva is not injected.     Pupils: Pupils are equal, round, and reactive to light.  Neck:     Thyroid: No thyroid mass, thyromegaly or thyroid tenderness.  Cardiovascular:     Rate and Rhythm: Normal rate.     Pulses: Normal pulses.          Radial pulses are 2+ on the right side and 2+ on the left side.       Posterior tibial pulses are 2+ on the right side  and 2+ on the left side.     Heart sounds: Normal heart sounds, S1 normal and S2 normal. No murmur heard.    No friction rub. No gallop.  Pulmonary:     Effort: Pulmonary effort is normal. No respiratory distress.     Breath sounds: Normal breath sounds. No stridor. No wheezing, rhonchi or rales.  Chest:     Chest wall: No mass, lacerations, deformity, swelling, tenderness, crepitus or edema. There is no dullness to percussion.  Breasts:    Right: Normal. No swelling, bleeding, inverted nipple, mass, nipple discharge, skin change or tenderness.     Left: Normal. No swelling, bleeding, inverted nipple, mass, nipple discharge, skin change or tenderness.  Abdominal:     General: Bowel sounds are normal. There is no distension.     Palpations: Abdomen is soft. There is no mass.     Tenderness: There is no abdominal tenderness. There is no guarding or rebound.     Hernia: No hernia is present.  Genitourinary:    Exam position: Lithotomy position.     Labia:        Right: No tenderness, lesion or injury.        Left: No tenderness, lesion or injury.      Vagina: No signs of injury and foreign body. No vaginal discharge, erythema, tenderness, bleeding, lesions or prolapsed vaginal walls.     Cervix: No cervical motion tenderness, discharge, friability, lesion, erythema, cervical bleeding or eversion.     Adnexa:        Right: No mass, tenderness or fullness.         Left: No mass, tenderness or fullness.       Comments: Mild erythema to  bilatealr labia majora - not bothersome to pt.  Musculoskeletal:        General: No swelling or tenderness. Normal range of motion.     Cervical back: Normal range of motion. No rigidity.  Lymphadenopathy:     Cervical: No cervical adenopathy.     Right cervical: No superficial, deep or posterior cervical adenopathy.    Left cervical: No superficial, deep or posterior cervical adenopathy.     Upper Body:     Right upper body: No supraclavicular, axillary or pectoral adenopathy.     Left upper body: No supraclavicular, axillary or pectoral adenopathy.  Skin:    General: Skin is warm and dry.     Capillary Refill: Capillary refill takes less than 2 seconds.     Findings: No bruising or erythema.  Neurological:     General: No focal deficit present.     Mental Status: She is alert and oriented to person, place, and time. Mental status is at baseline.     GCS: GCS eye subscore is 4. GCS verbal subscore is 5. GCS motor subscore is 6.     Cranial Nerves: No cranial nerve deficit.     Sensory: No sensory deficit.     Motor: No weakness, tremor or pronator drift.     Coordination: Romberg sign negative.     Gait: Gait is intact. Gait normal.  Psychiatric:        Attention and Perception: Attention and perception normal.        Mood and Affect: Mood and affect normal.        Speech: Speech normal.        Behavior: Behavior normal. Behavior is cooperative.        Thought Content: Thought content normal.  Cognition and Memory: Cognition and memory normal.        Judgment: Judgment normal.      No results found for any visits on 05/08/23.     Assessment & Plan:    Routine Health Maintenance and Physical Exam  Health Maintenance  Topic Date Due   Zoster (Shingles) Vaccine (2 of 2) 01/01/2021   COVID-19 Vaccine (3 - 2024-25 season) 12/14/2022   Pap with HPV screening  12/19/2022   Mammogram  03/29/2023   Flu Shot  07/13/2023*   Cologuard (Stool DNA test)  02/19/2024    DTaP/Tdap/Td vaccine (2 - Td or Tdap) 02/15/2032   Hepatitis C Screening  Completed   HIV Screening  Completed   HPV Vaccine  Aged Out  *Topic was postponed. The date shown is not the original due date.    Discussed health benefits of physical activity, and encouraged her to engage in regular exercise appropriate for her age and condition.  Annual physical exam Assessment & Plan: Sees dental every 6 months Eye exam done  on 05/07/23 - getting new glasses Routine labs today Pap today Breast exam performed today no concerns Mammogram ordered today Cologuard UTD Continue weight loss and exercise with lifestyle changes - tapering off weight loss medications today.  Wear sunscreen to protect again skin cancer - have skin awarness Wear seatbelt and helmet Pt feels safe at home Denies need for STI testing today. Continue daily multivitamin F/u 4-6 months for chronic disease mgmt.    Orders: -     CBC  Prediabetes Assessment & Plan: Chronic Will check A1C today Continue with control of diet and exercise  Orders: -     Hemoglobin A1c -     Lipid panel  Obesity (BMI 30.0-34.9) Assessment & Plan: Weight Management Patient has been on phentermine and Wellbutrin/naltrexone for weight loss.  She reports not seeing significant benefits and wishes to discontinue these medications. -Taper off Wellbutrin over a 6-week period, starting with 150mg  for 2 weeks, then 100mg  for 2 weeks, and finally 50mg  for 2 weeks. -Discontinue phentermine and naltrexone. BMI today 31.63, 208lbs - pt happy with this change Continue daily exercise and small frequent meal for weight loss  Orders: -     Lipid panel -     buPROPion HCl; Take 1 tablet (100 mg total) by mouth daily. Week 3 & 4 - take one tablet daily; Week 5 & 6 take half tablet daily - then stop medication.  Dispense: 21 tablet; Refill: 0  Primary hypertension Assessment & Plan: Chronic, mildly elevated today = 132/85 Continue hyzaar  50-12.5 mg at this time Patient denies concerns for feelings of low blood pressure or feelings of dizziness Low sodium diet, continue daily exercise, and weight loss Monitor at home with upper arm cuff 2x per week Goal SBP<130; DBP<85    Orders: -     Comprehensive metabolic panel -     Lipid panel  Encounter for screening mammogram for malignant neoplasm of breast -     3D Screening Mammogram, Left and Right; Future  Pap smear for cervical cancer screening -     Cytology - PAP  Moderate mixed hyperlipidemia not requiring statin therapy Assessment & Plan: Chronic, diet controlled repeat LP, fasting, with weight loss recommend diet low in saturated fat and regular exercise - 30 min at least 5 times per week     Return in about 6 months (around 11/05/2023) for chronic disease mgmt.    Meredith Staggers  Ephriam Knuckles, FNP, have reviewed all documentation for this visit. The documentation on 05/08/23 for the exam, diagnosis, procedures, and orders are all accurate and complete.   Sallee Provencal, FNP

## 2023-05-08 NOTE — Assessment & Plan Note (Signed)
Chronic, diet controlled repeat LP, fasting, with weight loss recommend diet low in saturated fat and regular exercise - 30 min at least 5 times per week

## 2023-05-08 NOTE — Assessment & Plan Note (Signed)
Chronic Will check A1C today Continue with control of diet and exercise

## 2023-05-08 NOTE — Assessment & Plan Note (Addendum)
Sees dental every 6 months Eye exam done  on 05/07/23 - getting new glasses Routine labs today Pap today Breast exam performed today no concerns Mammogram ordered today Cologuard UTD Continue weight loss and exercise with lifestyle changes - tapering off weight loss medications today.  Wear sunscreen to protect again skin cancer - have skin awarness Wear seatbelt and helmet Pt feels safe at home Denies need for STI testing today. Continue daily multivitamin F/u 4-6 months for chronic disease mgmt.

## 2023-05-08 NOTE — Assessment & Plan Note (Signed)
Chronic, mildly elevated today = 132/85 Continue hyzaar 50-12.5 mg at this time Patient denies concerns for feelings of low blood pressure or feelings of dizziness Low sodium diet, continue daily exercise, and weight loss Monitor at home with upper arm cuff 2x per week Goal SBP<130; DBP<85

## 2023-05-09 LAB — LIPID PANEL
Chol/HDL Ratio: 2.9 {ratio} (ref 0.0–4.4)
Cholesterol, Total: 219 mg/dL — ABNORMAL HIGH (ref 100–199)
HDL: 76 mg/dL (ref >39)
LDL Chol Calc (NIH): 130 mg/dL — ABNORMAL HIGH (ref 0–99)
Triglycerides: 73 mg/dL (ref 0–149)
VLDL Cholesterol Cal: 13 mg/dL (ref 5–40)

## 2023-05-09 LAB — CBC
Hematocrit: 45.4 % (ref 34.0–46.6)
Hemoglobin: 15.5 g/dL (ref 11.1–15.9)
MCH: 30.3 pg (ref 26.6–33.0)
MCHC: 34.1 g/dL (ref 31.5–35.7)
MCV: 89 fL (ref 79–97)
Platelets: 256 10*3/uL (ref 150–450)
RBC: 5.11 x10E6/uL (ref 3.77–5.28)
RDW: 12.4 % (ref 11.7–15.4)
WBC: 6.7 10*3/uL (ref 3.4–10.8)

## 2023-05-09 LAB — COMPREHENSIVE METABOLIC PANEL
ALT: 16 [IU]/L (ref 0–32)
AST: 18 [IU]/L (ref 0–40)
Albumin: 4.4 g/dL (ref 3.8–4.9)
Alkaline Phosphatase: 103 [IU]/L (ref 44–121)
BUN/Creatinine Ratio: 19 (ref 9–23)
BUN: 16 mg/dL (ref 6–24)
Bilirubin Total: 0.4 mg/dL (ref 0.0–1.2)
CO2: 24 mmol/L (ref 20–29)
Calcium: 9.4 mg/dL (ref 8.7–10.2)
Chloride: 99 mmol/L (ref 96–106)
Creatinine, Ser: 0.84 mg/dL (ref 0.57–1.00)
Globulin, Total: 3 g/dL (ref 1.5–4.5)
Glucose: 81 mg/dL (ref 70–99)
Potassium: 4 mmol/L (ref 3.5–5.2)
Sodium: 140 mmol/L (ref 134–144)
Total Protein: 7.4 g/dL (ref 6.0–8.5)
eGFR: 80 mL/min/{1.73_m2} (ref >59)

## 2023-05-09 LAB — HEMOGLOBIN A1C
Est. average glucose Bld gHb Est-mCnc: 123 mg/dL
Hgb A1c MFr Bld: 5.9 % — ABNORMAL HIGH (ref 4.8–5.6)

## 2023-05-11 ENCOUNTER — Encounter: Payer: Self-pay | Admitting: Family Medicine

## 2023-05-12 LAB — CYTOLOGY - PAP: Diagnosis: NEGATIVE

## 2023-05-28 ENCOUNTER — Other Ambulatory Visit: Payer: Self-pay | Admitting: Family Medicine

## 2023-05-28 DIAGNOSIS — E66811 Obesity, class 1: Secondary | ICD-10-CM

## 2023-11-02 ENCOUNTER — Telehealth: Payer: Self-pay | Admitting: Family Medicine

## 2023-11-02 ENCOUNTER — Other Ambulatory Visit: Payer: Self-pay

## 2023-11-02 DIAGNOSIS — I10 Essential (primary) hypertension: Secondary | ICD-10-CM

## 2023-11-02 MED ORDER — LOSARTAN POTASSIUM-HCTZ 50-12.5 MG PO TABS: 1.0000 | ORAL_TABLET | Freq: Every day | ORAL | 3 refills | Status: AC

## 2023-11-02 NOTE — Telephone Encounter (Signed)
 Sent refill to pharmacy, patient was last seen on 05/08/2023.

## 2023-11-02 NOTE — Telephone Encounter (Signed)
 [facility] faxed refill request for the following medications:   losartan-hydrochlorothiazide (HYZAAR) 50-12.5 MG tablet    Please advise

## 2023-11-06 ENCOUNTER — Encounter: Payer: Self-pay | Admitting: Family Medicine

## 2023-11-06 ENCOUNTER — Ambulatory Visit: Payer: Self-pay | Admitting: Family Medicine

## 2023-11-06 VITALS — BP 128/86 | HR 80 | Temp 97.6°F | Ht 68.0 in | Wt 239.9 lb

## 2023-11-06 DIAGNOSIS — E66811 Obesity, class 1: Secondary | ICD-10-CM | POA: Diagnosis not present

## 2023-11-06 DIAGNOSIS — E6609 Other obesity due to excess calories: Secondary | ICD-10-CM

## 2023-11-06 DIAGNOSIS — E782 Mixed hyperlipidemia: Secondary | ICD-10-CM

## 2023-11-06 DIAGNOSIS — Z6836 Body mass index (BMI) 36.0-36.9, adult: Secondary | ICD-10-CM

## 2023-11-06 DIAGNOSIS — I10 Essential (primary) hypertension: Secondary | ICD-10-CM | POA: Diagnosis not present

## 2023-11-06 DIAGNOSIS — E66812 Obesity, class 2: Secondary | ICD-10-CM

## 2023-11-06 DIAGNOSIS — R7303 Prediabetes: Secondary | ICD-10-CM | POA: Diagnosis not present

## 2023-11-06 MED ORDER — TOPIRAMATE 50 MG PO TABS: ORAL_TABLET | ORAL | 2 refills | Status: DC

## 2023-11-06 NOTE — Progress Notes (Signed)
 Established Patient Office Visit  Introduced to nurse practitioner role and practice setting.  All questions answered.  Discussed provider/patient relationship and expectations.   Subjective   Patient ID: Madeline Bryant, female    DOB: 1966/03/30  Age: 58 y.o. MRN: [id number]  Chief Complaint  Patient presents with   Medical Management of Chronic Issues   Hypertension   Discussed the use of AI scribe software for clinical note transcription with the patient, who gave verbal consent to proceed.  History of Present Illness Madeline Bryant is a 58 year old female with hypertension who presents for a blood pressure check and to discuss weight mgmt.  Her blood pressure was slightly better at her last visit. She has gained 30 pounds since January after discontinuing phentermine , which she stopped due to difficulty with bowel movements. She does not wish to resume phentermine .  She exercises every morning for 30 minutes, typically doing indoor walking exercises. Her diet includes yogurt, fruit, and granola for breakfast, and leftovers for lunch. She notes that her job is sedentary, which may contribute to her weight gain.  Prediabetes - She has a history of gestational diabetes during her second pregnancy and has been at a prediabetic A1c level of 5.9 consistently.   HTN -She is currently on a combination pill of losartan  50 mg and hydrochlorothiazide 12.5 mg for hypertension. Does not check BP at home. Denies headahces, visions changes, chest pain, DOB, SOB.       11/06/2023    8:06 AM 03/20/2023    8:06 AM 05/30/2022    8:35 AM  Depression screen PHQ 2/9  Decreased Interest 0 0 0  Down, Depressed, Hopeless 0 0 0  PHQ - 2 Score 0 0 0  Altered sleeping 0  0  Tired, decreased energy 0  0  Change in appetite 0  0  Feeling bad or failure about yourself  0  0  Trouble concentrating 0  0  Moving slowly or fidgety/restless 0  0  Suicidal thoughts 0  0  PHQ-9 Score 0  0  Difficult  doing work/chores Not difficult at all  Not difficult at all       11/06/2023    8:07 AM 03/20/2023    8:06 AM  GAD 7 : Generalized Anxiety Score  Nervous, Anxious, on Edge 0 0  Control/stop worrying 0 0  Worry too much - different things 0 0  Trouble relaxing 0 0  Restless 0 0  Easily annoyed or irritable 0 0  Afraid - awful might happen 0 0  Total GAD 7 Score 0 0  Anxiety Difficulty  Not difficult at all   Review of Systems  All other systems reviewed and are negative.   Negative unless indicated in HPI   Objective:     BP 128/86 (BP Location: Right Arm) Comment: Manual  Pulse 80   Temp 97.6 F (36.4 C) (Oral)   Ht 5' 8 (1.727 m)   Wt 239 lb 14.4 oz (108.8 kg)   SpO2 99%   BMI 36.48 kg/m    Physical Exam Constitutional:      General: She is not in acute distress.    Appearance: Normal appearance. She is obese. She is not ill-appearing, toxic-appearing or diaphoretic.  HENT:     Head: Normocephalic.     Nose: Nose normal.     Mouth/Throat:     Mouth: Mucous membranes are moist.     Pharynx: Oropharynx is clear.  Eyes:  Extraocular Movements: Extraocular movements intact.     Pupils: Pupils are equal, round, and reactive to light.  Neck:     Vascular: No carotid bruit.  Cardiovascular:     Rate and Rhythm: Normal rate and regular rhythm.     Pulses: Normal pulses.     Heart sounds: Normal heart sounds. No murmur heard.    No friction rub. No gallop.  Pulmonary:     Effort: No respiratory distress.     Breath sounds: No stridor. No wheezing, rhonchi or rales.  Chest:     Chest wall: No tenderness.  Musculoskeletal:     Right lower leg: No edema.     Left lower leg: No edema.  Lymphadenopathy:     Cervical: No cervical adenopathy.  Skin:    General: Skin is warm and dry.     Capillary Refill: Capillary refill takes less than 2 seconds.  Neurological:     General: No focal deficit present.     Mental Status: She is alert and oriented to person,  place, and time. Mental status is at baseline.     Cranial Nerves: No cranial nerve deficit.     Sensory: No sensory deficit.     Motor: No weakness.     Gait: Gait normal.  Psychiatric:        Mood and Affect: Mood normal.        Behavior: Behavior normal.        Thought Content: Thought content normal.        Judgment: Judgment normal.     No results found for any visits on 11/06/23.    The 10-year ASCVD risk score (Arnett DK, et al., 2019) is: 3.4%    Assessment & Plan:  Class 2 obesity due to excess calories without serious comorbidity with body mass index (BMI) of 36.0 to 36.9 in adult -     Topiramate; 1/2 tablet every morning for 14 days, then increase to 1 tablet every morning for 14 days, then 2 tablets every morning  Dispense: 60 tablet; Refill: 2 -     Amb ref to Medical Nutrition Therapy-MNT -     Basic metabolic panel with GFR -     Hemoglobin A1c -     Lipid panel  Prediabetes -     Hemoglobin A1c  Obesity (BMI 30.0-34.9)  Primary hypertension -     Basic metabolic panel with GFR  Moderate mixed hyperlipidemia not requiring statin therapy -     Lipid panel     Assessment and Plan Assessment & Plan Hypertension Blood pressure readings slightly above target. Discussed home monitoring and potential medication adjustment. - GOAL<130/80 - Recommend home blood pressure monitoring. - Continue losartan  50 mg and hydrochlorothiazide 12.5 mg. - Consider increasing medication dosage if blood pressure remains elevated.  Obesity Weight gain post-phentermine  discontinuation. Discussed topiramate for appetite suppression. She prefers non-injectable options. - Start topiramate with titration: half tablet for 2 weeks, then full tablet for 2 weeks, then two tablets. - Order referral to a nutritionist. - Encourage lifestyle modifications including exercise and dietary planning.  Prediabetes A1c at 5.9% indicates prediabetes. Emphasized lifestyle modifications. -  Order A1c test. - Encourage lifestyle modifications including diet and exercise.  Hyperlipidemia Previous cholesterol levels elevated. Need to reassess current status. - Order lipid panel. - limit fatty, sugary, processed foods.   Return in about 2 months (around 01/07/2024) for BP and Weight.   I, Curtis DELENA Boom, FNP, have reviewed all documentation  for this visit. The documentation on 11/06/23 for the exam, diagnosis, procedures, and orders are all accurate and complete.   Curtis DELENA Boom, FNP

## 2023-11-07 LAB — LIPID PANEL
Chol/HDL Ratio: 3.4 ratio (ref 0.0–4.4)
Cholesterol, Total: 212 mg/dL — ABNORMAL HIGH (ref 100–199)
HDL: 63 mg/dL (ref >39)
LDL Chol Calc (NIH): 129 mg/dL — ABNORMAL HIGH (ref 0–99)
Triglycerides: 111 mg/dL (ref 0–149)
VLDL Cholesterol Cal: 20 mg/dL (ref 5–40)

## 2023-11-07 LAB — BASIC METABOLIC PANEL WITH GFR
BUN/Creatinine Ratio: 21 (ref 9–23)
BUN: 15 mg/dL (ref 6–24)
CO2: 22 mmol/L (ref 20–29)
Calcium: 9.5 mg/dL (ref 8.7–10.2)
Chloride: 101 mmol/L (ref 96–106)
Creatinine, Ser: 0.73 mg/dL (ref 0.57–1.00)
Glucose: 92 mg/dL (ref 70–99)
Potassium: 4.7 mmol/L (ref 3.5–5.2)
Sodium: 141 mmol/L (ref 134–144)
eGFR: 95 mL/min/1.73 (ref >59)

## 2023-11-07 LAB — HEMOGLOBIN A1C
Est. average glucose Bld gHb Est-mCnc: 128 mg/dL
Hgb A1c MFr Bld: 6.1 % — ABNORMAL HIGH (ref 4.8–5.6)

## 2023-11-09 ENCOUNTER — Ambulatory Visit: Payer: Self-pay | Admitting: Family Medicine

## 2024-01-08 ENCOUNTER — Ambulatory Visit: Admitting: Family Medicine

## 2024-01-13 ENCOUNTER — Ambulatory Visit: Admitting: Physician Assistant

## 2024-01-13 ENCOUNTER — Encounter: Payer: Self-pay | Admitting: Physician Assistant

## 2024-01-13 VITALS — BP 114/65 | HR 85 | Ht 68.0 in | Wt 242.4 lb

## 2024-01-13 DIAGNOSIS — I10 Essential (primary) hypertension: Secondary | ICD-10-CM

## 2024-01-13 DIAGNOSIS — E7800 Pure hypercholesterolemia, unspecified: Secondary | ICD-10-CM

## 2024-01-13 DIAGNOSIS — Z23 Encounter for immunization: Secondary | ICD-10-CM

## 2024-01-13 DIAGNOSIS — R7303 Prediabetes: Secondary | ICD-10-CM | POA: Diagnosis not present

## 2024-01-13 MED ORDER — METFORMIN HCL ER 500 MG PO TB24: 500.0000 mg | ORAL_TABLET | Freq: Every day | ORAL | 1 refills | Status: DC

## 2024-01-13 NOTE — Progress Notes (Addendum)
 Established patient visit  Patient: Madeline Bryant   DOB: 12/13/1965   58 y.o. Female  MRN: [id number] Visit Date: 01/13/2024  Today's healthcare provider: Jolynn Spencer, PA-C   Chief Complaint  Patient presents with   Hypertension    Bp at home same numbers  9/14- 128/89, 124/91 ranges around those numbers   Subjective     HPI     Hypertension    Additional comments: Bp at home same numbers  9/14- 128/89, 124/91 ranges around those numbers      Last edited by Wilfred Hargis RAMAN, CMA on 01/13/2024  3:16 PM.       Discussed the use of AI scribe software for clinical note transcription with the patient, who gave verbal consent to proceed.  History of Present Illness Madeline Bryant is a 58 year old female with hypertension and elevated cholesterol who presents for a follow-up visit.  Her blood pressure remains stable with adherence to her morning medication regimen, and home readings are generally acceptable. She experiences shortness of breath, rapid heartbeats, and near syncope, but denies dizziness, blurry or double vision, bowel movement issues, or swelling. Her cholesterol was elevated in the last blood work on November 06, 2023, and she has not started medication for this. She was diagnosed with prediabetes and has not begun taking metformin. She was supposed to be contacted by a nutritionist after her vacation in August, but did not receive a follow-up call.       11/06/2023    8:06 AM 03/20/2023    8:06 AM 05/30/2022    8:35 AM  Depression screen PHQ 2/9  Decreased Interest 0 0 0  Down, Depressed, Hopeless 0 0 0  PHQ - 2 Score 0 0 0  Altered sleeping 0  0  Tired, decreased energy 0  0  Change in appetite 0  0  Feeling bad or failure about yourself  0  0  Trouble concentrating 0  0  Moving slowly or fidgety/restless 0  0  Suicidal thoughts 0  0  PHQ-9 Score 0  0  Difficult doing work/chores Not difficult at all  Not difficult at all      11/06/2023    8:07 AM  03/20/2023    8:06 AM  GAD 7 : Generalized Anxiety Score  Nervous, Anxious, on Edge 0 0  Control/stop worrying 0 0  Worry too much - different things 0 0  Trouble relaxing 0 0  Restless 0 0  Easily annoyed or irritable 0 0  Afraid - awful might happen 0 0  Total GAD 7 Score 0 0  Anxiety Difficulty  Not difficult at all    Medications: Outpatient Medications Prior to Visit  Medication Sig   losartan -hydrochlorothiazide (HYZAAR) 50-12.5 MG tablet Take 1 tablet by mouth daily.   Multiple Vitamin (MULTIVITAMIN) tablet Take 1 tablet by mouth daily.   Roflumilast  (ZORYVE ) 0.3 % CREA Apply to aa's psoriasis QD PRN.   topiramate  (TOPAMAX ) 50 MG tablet 1/2 tablet every morning for 14 days, then increase to 1 tablet every morning for 14 days, then 2 tablets every morning   No facility-administered medications prior to visit.    Review of Systems All negative Except see HPI   {Insert previous labs (optional):23779} {See past labs  Heme  Chem  Endocrine  Serology  Results Review (optional):1}   Objective    BP 114/65   Pulse 85   Ht 5' 8 (1.727 m)   Wt 242 lb 6.4 oz (  110 kg)   SpO2 99%   BMI 36.86 kg/m  {Insert last BP/Wt (optional):23777}{See vitals history (optional):1}   Physical Exam   No results found for any visits on 01/13/24.      Assessment and Plan Assessment & Plan Prediabetes Diagnosed with prediabetes. Metformin recommended. Discussed potential gastrointestinal side effects. Prescribed one-month supply due to insurance. - Prescribe metformin 500 mg with breakfast daily. - Refer to American Diabetic Association website for dietary guidance. - Schedule follow-up in one month to assess response and review labs.  Hypertension Blood pressure well-controlled with current medication. - Continue current blood pressure medication.  Hyperlipidemia Cholesterol elevated. Discussed potential need for medication if lifestyle changes insufficient. Emphasized diet  and exercise. - Encourage dietary changes and exercise. - Provide [facility] hospital lifestyle center contact for nutritional guidance. - Repeat lipid panel at next visit.  Cardiac arrhythmia, unspecified Irregular heart rhythm. No symptoms of chest pain or shortness of breath. - Order EKG to assess heart rhythm. - Advise monitoring for symptoms and seek immediate care if she occurs.    Orders Placed This Encounter  Procedures   Flu vaccine trivalent PF, 6mos and older(Flulaval,Afluria,Fluarix,Fluzone)    No follow-ups on file.   The patient was advised to call back or seek an in-person evaluation if the symptoms worsen or if the condition fails to improve as anticipated.  I discussed the assessment and treatment plan with the patient. The patient was provided an opportunity to ask questions and all were answered. The patient agreed with the plan and demonstrated an understanding of the instructions.  I, Ziv Welchel, PA-C have reviewed all documentation for this visit. The documentation on 01/13/2024  for the exam, diagnosis, procedures, and orders are all accurate and complete.  Jolynn Spencer, [facility], MMS [facility] [phone] (phone) [phone] (fax)  [facility]

## 2024-01-14 ENCOUNTER — Encounter: Payer: Self-pay | Admitting: Physician Assistant

## 2024-02-06 ENCOUNTER — Other Ambulatory Visit: Payer: Self-pay | Admitting: Physician Assistant

## 2024-02-06 DIAGNOSIS — R7303 Prediabetes: Secondary | ICD-10-CM

## 2024-02-12 ENCOUNTER — Ambulatory Visit: Admitting: Family Medicine

## 2024-02-14 ENCOUNTER — Other Ambulatory Visit: Payer: Self-pay | Admitting: Family Medicine

## 2024-02-14 DIAGNOSIS — E6609 Other obesity due to excess calories: Secondary | ICD-10-CM

## 2024-02-22 ENCOUNTER — Ambulatory Visit: Admitting: Family Medicine

## 2024-05-19 ENCOUNTER — Ambulatory Visit

## 2024-05-19 VITALS — BP 139/84 | HR 105 | Temp 97.4°F | Wt 240.0 lb

## 2024-05-19 DIAGNOSIS — Z6836 Body mass index (BMI) 36.0-36.9, adult: Secondary | ICD-10-CM

## 2024-05-19 DIAGNOSIS — I10 Essential (primary) hypertension: Secondary | ICD-10-CM | POA: Diagnosis not present

## 2024-05-19 DIAGNOSIS — Z1211 Encounter for screening for malignant neoplasm of colon: Secondary | ICD-10-CM | POA: Diagnosis not present

## 2024-05-19 DIAGNOSIS — R7303 Prediabetes: Secondary | ICD-10-CM | POA: Diagnosis not present

## 2024-05-19 DIAGNOSIS — Z1231 Encounter for screening mammogram for malignant neoplasm of breast: Secondary | ICD-10-CM

## 2024-05-19 DIAGNOSIS — E782 Mixed hyperlipidemia: Secondary | ICD-10-CM

## 2024-05-19 DIAGNOSIS — E6609 Other obesity due to excess calories: Secondary | ICD-10-CM

## 2024-05-19 DIAGNOSIS — E66812 Obesity, class 2: Secondary | ICD-10-CM

## 2024-05-19 MED ORDER — METFORMIN HCL ER 500 MG PO TB24: 500.0000 mg | ORAL_TABLET | Freq: Every day | ORAL | 3 refills | Status: AC

## 2024-05-19 MED ORDER — TOPIRAMATE 50 MG PO TABS: ORAL_TABLET | ORAL | 3 refills | Status: AC

## 2024-05-19 NOTE — Progress Notes (Signed)
 "   New patient visit   Patient: Madeline Bryant   DOB: 1965/09/24   59 y.o. Female  MRN: [id number] Visit Date: 05/19/2024  Today's healthcare provider: Isaiah DELENA Pepper, MD   Chief Complaint  Patient presents with   Transitions Of Care   Subjective    Madeline Bryant is a 59 y.o. female who presents today as a new patient to establish care.   Discussed the use of AI scribe software for clinical note transcription with the patient, who gave verbal consent to proceed.  History of Present Illness Madeline Bryant is a 59 year old female who presents for medication refills and follow-up.  Her blood pressure has been stable, although the systolic reading was slightly elevated today. She attributes improved stress management at work to her stable blood pressure.  She is taking metformin  500 mg once daily for prediabetes without significant side effects. She has not increased the dose since initiation.  She is on topiramate  100 mg daily, taking two tablets in the morning. It has helped reduce snacking, though significant weight loss has not been observed. Initial fatigue was noted but has since resolved.   Past Medical History:  Diagnosis Date   Hypertension    Past Surgical History:  Procedure Laterality Date   BREAST BIOPSY Left 04/17/2021   Affirm bx-calcs-RIBBON clip-path pending   NO PAST SURGERIES     Family Status  Relation Name Status   Mother Donia Brandy Deceased   Father Dallas JONELLE Brandy Deceased at age 25       causes of Death: CHF   Brother Fredonia Brandy (deceased) Deceased   Neg Hx  (Not Specified)  No partnership data on file   Family History  Problem Relation Age of Onset   Hypertension Mother    Hypertension Father    Prostate cancer Father    COPD Father    Kidney failure Father    Congestive Heart Failure Father    Esophageal cancer Brother    Cancer Brother    Breast cancer Neg Hx    Social History   Socioeconomic History   Marital  status: Married    Spouse name: Not on file   Number of children: Not on file   Years of education: Not on file   Highest education level: 12th grade  Occupational History   Not on file  Tobacco Use   Smoking status: Never   Smokeless tobacco: Never  Substance and Sexual Activity   Alcohol use: Never   Drug use: Never   Sexual activity: Not on file  Other Topics Concern   Not on file  Social History Narrative   Not on file   Social Drivers of Health   Tobacco Use: Low Risk (05/19/2024)   Patient History    Smoking Tobacco Use: Never    Smokeless Tobacco Use: Never    Passive Exposure: Not on file  Financial Resource Strain: Low Risk (02/18/2024)   Overall Financial Resource Strain (CARDIA)    Difficulty of Paying Living Expenses: Not very hard  Food Insecurity: No Food Insecurity (02/18/2024)   Epic    Worried About Programme Researcher, Broadcasting/film/video in the Last Year: Never true    Ran Out of Food in the Last Year: Never true  Transportation Needs: No Transportation Needs (02/18/2024)   Epic    Lack of Transportation (Medical): No    Lack of Transportation (Non-Medical): No  Physical Activity: Inactive (02/18/2024)   Exercise Vital Sign  Days of Exercise per Week: 0 days    Minutes of Exercise per Session: Not on file  Stress: No Stress Concern Present (02/18/2024)   [employer] of Occupational Health - Occupational Stress Questionnaire    Feeling of Stress: Not at all  Social Connections: Socially Integrated (02/18/2024)   Social Connection and Isolation Panel    Frequency of Communication with Friends and Family: More than three times a week    Frequency of Social Gatherings with Friends and Family: Once a week    Attends Religious Services: More than 4 times per year    Active Member of Clubs or Organizations: Yes    Attends Banker Meetings: More than 4 times per year    Marital Status: Married  Depression (PHQ2-9): Low Risk (05/19/2024)   Depression (PHQ2-9)     PHQ-2 Score: 0  Alcohol Screen: Low Risk (05/30/2022)   Alcohol Screen    Last Alcohol Screening Score (AUDIT): 0  Housing: Low Risk (02/18/2024)   Epic    Unable to Pay for Housing in the Last Year: No    Number of Times Moved in the Last Year: 0    Homeless in the Last Year: No  Utilities: Not on file  Health Literacy: Not on file   Show/hide medication list[1] Allergies[2]  Reviews of Systems as noted in HPI.      Objective    BP 139/84   Pulse (!) 105   Temp (!) 97.4 F (36.3 C) (Oral)   Wt 240 lb (108.9 kg)   SpO2 97%   BMI 36.49 kg/m     Physical Exam Constitutional:      Appearance: Normal appearance.  HENT:     Head: Normocephalic and atraumatic.     Mouth/Throat:     Mouth: Mucous membranes are moist.  Eyes:     Pupils: Pupils are equal, round, and reactive to light.  Pulmonary:     Effort: Pulmonary effort is normal.  Skin:    General: Skin is warm.  Neurological:     General: No focal deficit present.     Mental Status: She is alert.     BP Readings from Last 3 Encounters:  05/19/24 139/84  01/13/24 114/65  11/06/23 128/86    Wt Readings from Last 3 Encounters:  05/19/24 240 lb (108.9 kg)  01/13/24 242 lb 6.4 oz (110 kg)  11/06/23 239 lb 14.4 oz (108.8 kg)    Depression Screen    05/19/2024    4:14 PM 11/06/2023    8:06 AM 03/20/2023    8:06 AM 05/30/2022    8:35 AM  PHQ 2/9 Scores  PHQ - 2 Score 0 0 0 0  PHQ- 9 Score  0   0      Data saved with a previous flowsheet row definition   No results found for any visits on 05/19/24.  Assessment & Plan      Problem List Items Addressed This Visit       Cardiovascular and Mediastinum   Primary hypertension - Primary   Relevant Orders   Comprehensive metabolic panel with GFR     Other   Moderate mixed hyperlipidemia not requiring statin therapy   Relevant Orders   Lipid panel   Prediabetes   Relevant Medications   metFORMIN  (GLUCOPHAGE -XR) 500 MG 24 hr tablet   Other Relevant  Orders   Hemoglobin A1c   Class 2 obesity due to excess calories without serious comorbidity with body mass index (  BMI) of 36.0 to 36.9 in adult   Relevant Medications   metFORMIN  (GLUCOPHAGE -XR) 500 MG 24 hr tablet   topiramate  (TOPAMAX ) 50 MG tablet   Other Relevant Orders   Comprehensive metabolic panel with GFR   Other Visit Diagnoses       Screening for colon cancer       Relevant Orders   Cologuard     Screening mammogram for breast cancer       Relevant Orders   MM 3D SCREENING MAMMOGRAM BILATERAL BREAST      Assessment & Plan Obesity Weight has been stable since July. Topiramate  is being used for weight management, reducing cravings. She prefers not to use injectable medications due to potential side effects like constipation. - Increased topiramate  to 150 mg daily, with two tablets in the morning and one tablet at night. - Encouraged regular exercise to aid weight management.  Prediabetes Blood sugar levels are in the prediabetic range. Metformin  is being used with no significant side effects. - Continue metformin  at current dose. - Will consider increasing metformin  if further weight loss is desired.  Primary hypertension Blood pressure is well-controlled. No changes to current antihypertensive regimen are necessary. - Continue current antihypertensive medication regimen.  Hyperlipidemia Due for blood work to check cholesterol levels. Fasting blood work is preferred for accurate results. - Ordered fasting blood work to check cholesterol levels.  General health maintenance Due for routine screenings including mammogram and Cologuard test. Blood work is also due to check blood sugar, cholesterol, and kidney function. - Ordered mammogram for routine screening. - Ordered Cologuard test. - Scheduled fasting blood work for blood sugar, cholesterol, and kidney function.     Return in about 6 months (around 11/16/2024) for Follow Up.      Isaiah DELENA Pepper, MD   [facility] [phone] (phone) [phone] (fax)     [1]  Outpatient Medications Prior to Visit  Medication Sig Note   losartan -hydrochlorothiazide (HYZAAR) 50-12.5 MG tablet Take 1 tablet by mouth daily.    Multiple Vitamin (MULTIVITAMIN) tablet Take 1 tablet by mouth daily.    Roflumilast  (ZORYVE ) 0.3 % CREA Apply to aa's psoriasis QD PRN. 05/19/2024: As Needed   [DISCONTINUED] metFORMIN  (GLUCOPHAGE -XR) 500 MG 24 hr tablet TAKE 1 TABLET BY MOUTH EVERY DAY WITH BREAKFAST    [DISCONTINUED] topiramate  (TOPAMAX ) 50 MG tablet 1/2 TABLET EVERY MORNING FOR 14 DAYS, THEN INCREASE TO 1 TABLET EVERY MORNING FOR 14 DAYS, THEN 2 TABLETS EVERY MORNING    No facility-administered medications prior to visit.  [2] No Known Allergies  "

## 2024-05-19 NOTE — Patient Instructions (Signed)
 [facility]   [address], [address] [place],  [place]  [address] Main: [phone]

## 2024-06-16 ENCOUNTER — Encounter

## 2024-11-16 ENCOUNTER — Ambulatory Visit
